# Patient Record
Sex: Female | Born: 1945 | Race: White | Hispanic: No | Smoking: Former smoker
Health system: Southern US, Community
[De-identification: ages and names within clinical notes are randomized; demographics above are authoritative.]

## PROBLEM LIST (undated history)

## (undated) DIAGNOSIS — I10 Essential (primary) hypertension: Secondary | ICD-10-CM

## (undated) DIAGNOSIS — I607 Nontraumatic subarachnoid hemorrhage from unspecified intracranial artery: Secondary | ICD-10-CM

## (undated) DIAGNOSIS — I639 Cerebral infarction, unspecified: Secondary | ICD-10-CM

## (undated) DIAGNOSIS — F32A Depression, unspecified: Secondary | ICD-10-CM

## (undated) DIAGNOSIS — E785 Hyperlipidemia, unspecified: Secondary | ICD-10-CM

## (undated) DIAGNOSIS — R32 Unspecified urinary incontinence: Secondary | ICD-10-CM

## (undated) DIAGNOSIS — I2510 Atherosclerotic heart disease of native coronary artery without angina pectoris: Secondary | ICD-10-CM

## (undated) DIAGNOSIS — F329 Major depressive disorder, single episode, unspecified: Secondary | ICD-10-CM

## (undated) DIAGNOSIS — Z8719 Personal history of other diseases of the digestive system: Secondary | ICD-10-CM

## (undated) HISTORY — DX: Hyperlipidemia, unspecified: E78.5

## (undated) HISTORY — DX: Nontraumatic subarachnoid hemorrhage from unspecified intracranial artery: I60.7

## (undated) HISTORY — PX: TUBAL LIGATION: SHX77

## (undated) HISTORY — DX: Depression, unspecified: F32.A

## (undated) HISTORY — DX: Unspecified urinary incontinence: R32

## (undated) HISTORY — DX: Atherosclerotic heart disease of native coronary artery without angina pectoris: I25.10

## (undated) HISTORY — DX: Major depressive disorder, single episode, unspecified: F32.9

## (undated) HISTORY — DX: Essential (primary) hypertension: I10

## (undated) HISTORY — PX: CORONARY ANGIOPLASTY WITH STENT PLACEMENT: SHX49

---
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
## 2004-07-01 DIAGNOSIS — I607 Nontraumatic subarachnoid hemorrhage from unspecified intracranial artery: Secondary | ICD-10-CM

## 2004-07-01 HISTORY — DX: Nontraumatic subarachnoid hemorrhage from unspecified intracranial artery: I60.7

## 2004-07-01 HISTORY — PX: CEREBRAL ANEURYSM REPAIR: SHX164

## 2004-07-01 HISTORY — PX: ORIF HIP FRACTURE: SHX2125

## 2008-07-05 ENCOUNTER — Ambulatory Visit: Payer: Self-pay | Admitting: Family Medicine

## 2008-07-05 DIAGNOSIS — F329 Major depressive disorder, single episode, unspecified: Secondary | ICD-10-CM

## 2008-07-05 DIAGNOSIS — J309 Allergic rhinitis, unspecified: Secondary | ICD-10-CM | POA: Insufficient documentation

## 2008-07-05 DIAGNOSIS — I10 Essential (primary) hypertension: Secondary | ICD-10-CM | POA: Insufficient documentation

## 2008-07-05 DIAGNOSIS — E785 Hyperlipidemia, unspecified: Secondary | ICD-10-CM

## 2008-07-05 DIAGNOSIS — I252 Old myocardial infarction: Secondary | ICD-10-CM | POA: Insufficient documentation

## 2008-07-05 DIAGNOSIS — I609 Nontraumatic subarachnoid hemorrhage, unspecified: Secondary | ICD-10-CM

## 2008-07-05 DIAGNOSIS — N318 Other neuromuscular dysfunction of bladder: Secondary | ICD-10-CM | POA: Insufficient documentation

## 2008-07-05 LAB — CONVERTED CEMR LAB: Pap Smear: NORMAL

## 2008-07-06 LAB — CONVERTED CEMR LAB
Albumin: 4.6 g/dL (ref 3.5–5.2)
Alkaline Phosphatase: 49 units/L (ref 39–117)
BUN: 17 mg/dL (ref 6–23)
CO2: 27 meq/L (ref 19–32)
Calcium: 9.4 mg/dL (ref 8.4–10.5)
Chloride: 102 meq/L (ref 96–112)
Glucose, Bld: 99 mg/dL (ref 70–99)
LDL Cholesterol: 104 mg/dL — ABNORMAL HIGH (ref 0–99)
Lymphocytes Relative: 24 % (ref 12–46)
Lymphs Abs: 2.2 10*3/uL (ref 0.7–4.0)
Monocytes Relative: 6 % (ref 3–12)
Neutro Abs: 6.2 10*3/uL (ref 1.7–7.7)
Neutrophils Relative %: 68 % (ref 43–77)
Platelets: 256 10*3/uL (ref 150–400)
Potassium: 4 meq/L (ref 3.5–5.3)
RBC: 4.7 M/uL (ref 3.87–5.11)
Sodium: 144 meq/L (ref 135–145)
Total Protein: 7.4 g/dL (ref 6.0–8.3)
Triglycerides: 143 mg/dL (ref <150)
WBC: 9.1 10*3/uL (ref 4.0–10.5)

## 2008-08-01 ENCOUNTER — Encounter (INDEPENDENT_AMBULATORY_CARE_PROVIDER_SITE_OTHER): Payer: Self-pay | Admitting: Family Medicine

## 2008-08-02 ENCOUNTER — Other Ambulatory Visit: Admission: RE | Admit: 2008-08-02 | Discharge: 2008-08-02 | Payer: Self-pay | Admitting: Family Medicine

## 2008-08-03 ENCOUNTER — Ambulatory Visit: Payer: Self-pay | Admitting: Family Medicine

## 2008-08-03 ENCOUNTER — Encounter (INDEPENDENT_AMBULATORY_CARE_PROVIDER_SITE_OTHER): Payer: Self-pay | Admitting: Family Medicine

## 2008-08-03 LAB — CONVERTED CEMR LAB
HDL goal, serum: 40 mg/dL
LDL Goal: 100 mg/dL

## 2008-08-09 ENCOUNTER — Ambulatory Visit (HOSPITAL_COMMUNITY): Admission: RE | Admit: 2008-08-09 | Discharge: 2008-08-09 | Payer: Self-pay | Admitting: Family Medicine

## 2008-08-16 ENCOUNTER — Ambulatory Visit: Payer: Self-pay | Admitting: Cardiology

## 2008-08-17 ENCOUNTER — Encounter (INDEPENDENT_AMBULATORY_CARE_PROVIDER_SITE_OTHER): Payer: Self-pay | Admitting: Family Medicine

## 2008-08-17 ENCOUNTER — Ambulatory Visit (HOSPITAL_COMMUNITY): Admission: RE | Admit: 2008-08-17 | Discharge: 2008-08-17 | Payer: Self-pay | Admitting: Cardiology

## 2008-08-17 ENCOUNTER — Encounter: Payer: Self-pay | Admitting: Cardiology

## 2008-08-18 ENCOUNTER — Ambulatory Visit (HOSPITAL_COMMUNITY): Admission: RE | Admit: 2008-08-18 | Discharge: 2008-08-18 | Payer: Self-pay | Admitting: Family Medicine

## 2008-08-18 ENCOUNTER — Ambulatory Visit: Payer: Self-pay | Admitting: Cardiology

## 2008-09-21 ENCOUNTER — Ambulatory Visit: Payer: Self-pay | Admitting: Family Medicine

## 2008-09-22 ENCOUNTER — Encounter (INDEPENDENT_AMBULATORY_CARE_PROVIDER_SITE_OTHER): Payer: Self-pay | Admitting: Family Medicine

## 2008-09-22 LAB — CONVERTED CEMR LAB
ALT: 22 units/L (ref 0–35)
AST: 19 units/L (ref 0–37)
Calcium: 9.9 mg/dL (ref 8.4–10.5)
Chloride: 102 meq/L (ref 96–112)
Creatinine, Ser: 0.95 mg/dL (ref 0.40–1.20)
Sodium: 142 meq/L (ref 135–145)
Total Bilirubin: 0.5 mg/dL (ref 0.3–1.2)
Total Protein: 7 g/dL (ref 6.0–8.3)

## 2008-11-03 ENCOUNTER — Ambulatory Visit: Payer: Self-pay | Admitting: Family Medicine

## 2008-11-04 ENCOUNTER — Encounter (INDEPENDENT_AMBULATORY_CARE_PROVIDER_SITE_OTHER): Payer: Self-pay | Admitting: *Deleted

## 2008-11-04 ENCOUNTER — Encounter (INDEPENDENT_AMBULATORY_CARE_PROVIDER_SITE_OTHER): Payer: Self-pay | Admitting: Family Medicine

## 2008-11-04 LAB — CONVERTED CEMR LAB
CO2: 26 meq/L (ref 19–32)
Cholesterol: 160 mg/dL (ref 0–200)
Creatinine, Ser: 1.13 mg/dL (ref 0.40–1.20)
Glucose, Bld: 93 mg/dL (ref 70–99)
Total Bilirubin: 0.3 mg/dL (ref 0.3–1.2)
Total CHOL/HDL Ratio: 3.3
Triglycerides: 132 mg/dL (ref <150)
VLDL: 26 mg/dL (ref 0–40)

## 2009-03-10 ENCOUNTER — Ambulatory Visit: Payer: Self-pay | Admitting: Family Medicine

## 2009-08-03 ENCOUNTER — Encounter (INDEPENDENT_AMBULATORY_CARE_PROVIDER_SITE_OTHER): Payer: Self-pay | Admitting: *Deleted

## 2009-08-03 ENCOUNTER — Ambulatory Visit: Payer: Self-pay | Admitting: Family Medicine

## 2009-08-03 DIAGNOSIS — R5381 Other malaise: Secondary | ICD-10-CM

## 2009-08-03 DIAGNOSIS — R5383 Other fatigue: Secondary | ICD-10-CM

## 2009-08-03 LAB — CONVERTED CEMR LAB
ALT: 18 units/L
ALT: 18 units/L (ref 0–35)
AST: 11 units/L
AST: 11 units/L (ref 0–37)
Albumin: 4.5 g/dL
Alkaline Phosphatase: 45 units/L
Basophils Absolute: 0 10*3/uL (ref 0.0–0.1)
Basophils Relative: 0 % (ref 0–1)
Bilirubin, Direct: 0.1 mg/dL (ref 0.0–0.3)
Calcium: 10.2 mg/dL (ref 8.4–10.5)
Cholesterol: 158 mg/dL (ref 0–200)
Glucose, Bld: 97 mg/dL
Glucose, Bld: 97 mg/dL (ref 70–99)
HDL: 49 mg/dL
HDL: 49 mg/dL (ref >39)
LDL Cholesterol: 80 mg/dL
MCHC: 32.2 g/dL (ref 30.0–36.0)
Neutro Abs: 6 10*3/uL (ref 1.7–7.7)
Neutrophils Relative %: 67 % (ref 43–77)
Platelets: 247 10*3/uL (ref 150–400)
Potassium: 4.3 meq/L
RDW: 13.9 % (ref 11.5–15.5)
Sodium: 141 meq/L
Sodium: 141 meq/L (ref 135–145)
TSH: 2.851 microintl units/mL (ref 0.350–4.500)
Total CHOL/HDL Ratio: 3.2
Total Protein: 7 g/dL
Triglycerides: 144 mg/dL

## 2009-08-08 ENCOUNTER — Ambulatory Visit (HOSPITAL_COMMUNITY): Admission: RE | Admit: 2009-08-08 | Discharge: 2009-08-08 | Payer: Self-pay | Admitting: Family Medicine

## 2009-08-10 ENCOUNTER — Ambulatory Visit (HOSPITAL_COMMUNITY): Admission: RE | Admit: 2009-08-10 | Discharge: 2009-08-10 | Payer: Self-pay | Admitting: Family Medicine

## 2009-08-17 ENCOUNTER — Encounter: Payer: Self-pay | Admitting: Family Medicine

## 2009-08-23 ENCOUNTER — Ambulatory Visit (HOSPITAL_COMMUNITY): Admission: RE | Admit: 2009-08-23 | Discharge: 2009-08-23 | Payer: Self-pay | Admitting: Family Medicine

## 2009-09-01 ENCOUNTER — Encounter (INDEPENDENT_AMBULATORY_CARE_PROVIDER_SITE_OTHER): Payer: Self-pay | Admitting: *Deleted

## 2009-09-05 ENCOUNTER — Ambulatory Visit: Payer: Self-pay | Admitting: Cardiology

## 2009-09-11 DIAGNOSIS — I2510 Atherosclerotic heart disease of native coronary artery without angina pectoris: Secondary | ICD-10-CM | POA: Insufficient documentation

## 2009-12-06 ENCOUNTER — Ambulatory Visit: Payer: Self-pay | Admitting: Family Medicine

## 2009-12-06 DIAGNOSIS — L989 Disorder of the skin and subcutaneous tissue, unspecified: Secondary | ICD-10-CM | POA: Insufficient documentation

## 2009-12-06 DIAGNOSIS — D649 Anemia, unspecified: Secondary | ICD-10-CM

## 2009-12-08 ENCOUNTER — Encounter: Payer: Self-pay | Admitting: Family Medicine

## 2010-01-16 ENCOUNTER — Telehealth: Payer: Self-pay | Admitting: Family Medicine

## 2010-02-07 ENCOUNTER — Encounter: Payer: Self-pay | Admitting: Family Medicine

## 2010-02-08 LAB — CONVERTED CEMR LAB
AST: 13 units/L (ref 0–37)
Alkaline Phosphatase: 33 units/L — ABNORMAL LOW (ref 39–117)
BUN: 18 mg/dL (ref 6–23)
Bilirubin, Direct: 0.2 mg/dL (ref 0.0–0.3)
CO2: 29 meq/L (ref 19–32)
Calcium: 9.6 mg/dL (ref 8.4–10.5)
Creatinine, Ser: 0.98 mg/dL (ref 0.40–1.20)
Glucose, Bld: 103 mg/dL — ABNORMAL HIGH (ref 70–99)
LDL Cholesterol: 74 mg/dL (ref 0–99)
Total Bilirubin: 0.8 mg/dL (ref 0.3–1.2)

## 2010-02-27 ENCOUNTER — Ambulatory Visit: Payer: Self-pay | Admitting: Family Medicine

## 2010-03-20 ENCOUNTER — Ambulatory Visit: Payer: Self-pay | Admitting: Family Medicine

## 2010-07-11 LAB — CONVERTED CEMR LAB
ALT: 18 units/L (ref 0–35)
AST: 13 units/L (ref 0–37)
Alkaline Phosphatase: 47 units/L (ref 39–117)
Basophils Absolute: 0 10*3/uL (ref 0.0–0.1)
Bilirubin, Direct: 0.3 mg/dL (ref 0.0–0.3)
Calcium: 10.2 mg/dL (ref 8.4–10.5)
Cholesterol: 161 mg/dL (ref 0–200)
Creatinine, Ser: 0.91 mg/dL (ref 0.40–1.20)
Eosinophils Absolute: 0.2 10*3/uL (ref 0.0–0.7)
Eosinophils Relative: 2 % (ref 0–5)
Glucose, Bld: 106 mg/dL — ABNORMAL HIGH (ref 70–99)
HCT: 40.8 % (ref 36.0–46.0)
Hemoglobin: 13.5 g/dL (ref 12.0–15.0)
Indirect Bilirubin: 0.4 mg/dL (ref 0.0–0.9)
Lymphs Abs: 2.3 10*3/uL (ref 0.7–4.0)
MCV: 85.5 fL (ref 78.0–100.0)
Monocytes Absolute: 0.6 10*3/uL (ref 0.1–1.0)
Platelets: 243 10*3/uL (ref 150–400)
RDW: 14.3 % (ref 11.5–15.5)
Sodium: 145 meq/L (ref 135–145)
TSH: 3.423 microintl units/mL (ref 0.350–4.500)
Total Bilirubin: 0.7 mg/dL (ref 0.3–1.2)
Total CHOL/HDL Ratio: 3.7

## 2010-07-18 ENCOUNTER — Ambulatory Visit
Admission: RE | Admit: 2010-07-18 | Discharge: 2010-07-18 | Payer: Self-pay | Source: Home / Self Care | Attending: Family Medicine | Admitting: Family Medicine

## 2010-07-20 ENCOUNTER — Other Ambulatory Visit (HOSPITAL_COMMUNITY): Payer: Self-pay | Admitting: *Deleted

## 2010-07-20 DIAGNOSIS — Z139 Encounter for screening, unspecified: Secondary | ICD-10-CM

## 2010-07-31 NOTE — Letter (Signed)
Summary: breast center order form  breast center order form   Imported By: Rudene Anda 08/17/2009 13:25:28  _____________________________________________________________________  External Attachment:    Type:   Image     Comment:   External Document

## 2010-07-31 NOTE — Progress Notes (Signed)
Summary: MEDICINE  Phone Note Call from Patient   Summary of Call: dr tapered her off her PROZAC AND NOW SHE NEEDS THEM BACK SHE IS FEELING ANXIOUS AND AFRAID [facility] [place] 3 MONTH SUPPLY CALL BACK AT [phone] TO LET HER KNOW Initial call taken by: Lind Guest,  January 16, 2010 9:21 AM  Follow-up for Phone Call        pls tell her 90 day supply sent in andshe needs to sched an appt to come in in the next 6 to 8 weeks Follow-up by: Syliva Overman MD,  January 16, 2010 12:15 PM  Additional Follow-up for Phone Call Additional follow up Details #1::        Phone Call Completed Additional Follow-up by: Adella Hare LPN,  January 16, 2010 2:44 PM    New/Updated Medications: FLUOXETINE HCL 20 MG CAPS (FLUOXETINE HCL) Take 1 capsule by mouth once a day Prescriptions: FLUOXETINE HCL 20 MG CAPS (FLUOXETINE HCL) Take 1 capsule by mouth once a day  #90 x 0   Entered and Authorized by:   Syliva Overman MD   Signed by:   Syliva Overman MD on 01/16/2010   Method used:   Electronically to        [facility] [address] [facility] (retail)       [address]       [place], [place]  [address]       Ph: [phone]       Fax: [phone]   RxID:   [id number]

## 2010-07-31 NOTE — Assessment & Plan Note (Signed)
Summary: NEW PATIENT   Vital Signs:  Patient profile:   65 year old female Height:      62 inches Weight:      150 pounds BMI:     27.53 O2 Sat:      96 % Pulse rate:   66 / minute Pulse rhythm:   regular Resp:     16 per minute BP sitting:   122 / 80  Vitals Entered By: Everitt Amber (August 03, 2009 8:08 AM)  Nutrition Counseling: Patient's BMI is greater than 25 and therefore counseled on weight management options. CC: New Patient   Primary Care Provider:  Syliva Overman MD  CC:  New Patient.  History of Present Illness: new pt eval for Angela Lambert, whose med history is reviewed from old records. she states she has been doing fairly well since she was last seen by gher previous provider.  She has no specific  complaints or concerns.  Denies recent fever or chills. Denies sinus pressure, nasal congestion , ear pain or sore throat. Denies chest congestion, or cough productive of sputum. Denies chest pain, palpitations, PND, orthopnea or leg swelling. Denies abdominal pain, nausea, vomitting, diarrhea or constipation. Denies change in bowel movements or bloody stool. Denies dysuria , frequency, incontinence or hesitancy. Denies  joint pain, swelling, or reduced mobility. Denies headaches, vertigo, seizures. Denies depression, anxiety or insomnia. Denies  rash, lesions, or itch.      Current Medications (verified): 1)  Simvastatin 80 Mg Tabs (Simvastatin) .... One Daily 2)  Prozac 20 Mg Caps (Fluoxetine Hcl) .... Once Daily 3)  Metoprolol Tartrate 50 Mg Tabs (Metoprolol Tartrate) .... Once Daily 4)  Lisinopril 40 Mg Tabs (Lisinopril) .... One Daily 5)  Hydrochlorothiazide 12.5 Mg Caps (Hydrochlorothiazide) .... Once Daily 6)  Aspirin Ec 325 Mg Tbec (Aspirin) .... Once Daily 7)  Multivitamins  Tabs (Multiple Vitamin) .... Once Daily 8)  Garlic 1000 Mg Caps (Garlic) .... Once Daily 9)  Vitamin D 400 Unit Caps (Cholecalciferol) .... Once Daily 10)  Calcium  600/vitamin D 600-400 Mg-Unit Tabs (Calcium Carbonate-Vitamin D) .... One Two Times A Day  Allergies (verified): No Known Drug Allergies  Past History:  Family History: Last updated: 07/05/2008 Father - age ? 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  Risk Factors: Alcohol Use: 0 (11/03/2008)  Risk Factors: Smoking Status: quit (11/03/2008) Packs/Day: 2.0 (11/03/2008)  Past Medical History: Current Problems:  CAD (ICD-414.00)  stent placed 05/2002 OVERACTIVE BLADDER (ICD-596.51), stress incontinence MYOCARDIAL INFARCTION, HX OF (ICD-412) - Age 59 HYPERTENSION (ICD-401.9) HYPERLIPIDEMIA (ICD-272.4) DEPRESSION (ICD-311)  2006,  after rehab from stroke for 1 month ALLERGIC RHINITIS (ICD-477.9) 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 Endo:  Denies cold intolerance, excessive hunger, excessive thirst, excessive urination, heat intolerance, polyuria, and weight change. Heme:  Denies abnormal bruising and bleeding. Allergy:  Denies hives or rash and sneezing.  Physical Exam  General:  Well-developed,well-nourished,in no acute distress; alert,appropriate and cooperative throughout examination HEENT: No facial  asymmetry,  EOMI, No sinus tenderness, TM's Clear, oropharynx  pink and moist.   Chest: Clear to auscultation bilaterally.  CVS: S1, S2, No murmurs, No S3.   Abd: Soft, Nontender.  MS: Adequate ROM spine, hips, shoulders and knees. Ambulates with a cane Ext: No edema.   CNS: CN 2-12 intact, power tone and sensation normal throughout.   Skin: Intact, no visible lesions or rashes.  Psych: Good eye contact, normal affect.  Memory , loss,not anxious or depressed appearing.    Impression & Recommendations:  Problem # 1:  SPECIAL SCREENING FOR OSTEOPOROSIS (ICD-V82.81) Assessment Comment Only  Orders: Radiology Referral (Radiology)  Problem # 2:  HYPERTENSION (ICD-401.9) Assessment: Unchanged  Her updated medication list for this problem includes:    Metoprolol Tartrate 50 Mg Tabs (Metoprolol tartrate) ..... Once daily    Lisinopril 40 Mg Tabs (Lisinopril) ..... One daily    Hydrochlorothiazide 12.5 Mg Caps (Hydrochlorothiazide) ..... Once daily  BP today: 122/80 Prior BP: 133/74 (03/10/2009)  Prior 10 Yr Risk Heart Disease: N/A (07/05/2008)  Labs Reviewed: K+: 4.4 (11/04/2008) Creat: : 1.13 (11/04/2008)   Chol: 160 (11/04/2008)   HDL: 49 (11/04/2008)   LDL: 85 (11/04/2008)   TG: 132 (11/04/2008)  Orders: T-Basic Metabolic Panel [phone])  Problem # 3:  HYPERLIPIDEMIA (ICD-272.4) Assessment: Comment Only  Her updated medication list for this problem includes:    Simvastatin 80 Mg Tabs (Simvastatin) ..... One daily  Orders: T-Hepatic Function [phone]) T-Lipid Profile [phone])  Labs Reviewed: SGOT: 15 (11/04/2008)   SGPT: 27 (11/04/2008)  Lipid Goals: Chol Goal: 200 (08/03/2008)   HDL Goal: 40 (08/03/2008)   LDL Goal: 100 (08/03/2008)   TG Goal: 150 (08/03/2008)  Prior 10 Yr Risk Heart Disease: N/A (07/05/2008)   HDL:49 (11/04/2008), 56 (07/05/2008)  LDL:85 (11/04/2008), 104 (57/84/6962)  Chol:160 (11/04/2008), 189 (07/05/2008)  Trig:132 (11/04/2008),  143 (07/05/2008)  Complete Medication List: 1)  Simvastatin 80 Mg Tabs (Simvastatin) .... One daily 2)  Metoprolol Tartrate 50 Mg Tabs (Metoprolol tartrate) .... Once daily 3)  Lisinopril 40 Mg Tabs (Lisinopril) .... One daily 4)  Hydrochlorothiazide 12.5 Mg Caps (Hydrochlorothiazide) .... Once daily 5)  Aspirin Ec 325 Mg Tbec (Aspirin) .... Once daily 6)  Multivitamins Tabs (Multiple vitamin) .... Once daily 7)  Garlic 1000 Mg Caps (Garlic) .... Once daily 8)  Vitamin D 400 Unit Caps (Cholecalciferol) .... Once daily 9)  Calcium 600/vitamin D 600-400 Mg-unit Tabs (Calcium carbonate-vitamin d) .... One two times a day 10)  Prozac 10 Mg Caps (Fluoxetine hcl) .... Use as directed 11)  Alendronate Sodium 70 Mg Tabs (Alendronate sodium) .... One tablet once weekly  Other Orders: T-CBC w/Diff [phone]) T-TSH [phone]) T-Vitamin D (25-Hydroxy) [phone]) Cardiology Referral (Cardiology) Tdap => 15yrs IM [phone]) Admin 1st Vaccine (56433) Admin 1st Vaccine [facility]) [phone])  Patient Instructions: 1)  CPE months. 2)  yOU WILL BE REFERRED FOR A BONE DENSITY SCSN AND TO F/U WITH dR ROT[facility]ART. 3)  BMP prior to visit, ICD-9: 4)  Hepatic Panel prior to visit, ICD-9: 5)  Lipid Panel prior to visit, ICD-9: 6)  TSH prior to visit, ICD-9: 7)  CBC w/ Diff prior to visit,  ICD-9: 8)  Vit D level  9)  PROZAC 10 mg tapering doses as below 10)  one daily for 3 weeks then one every Mon,Wed , fri and sunday for 3 weeks , then one every Tues and Thursday  , Saturdayfor 3 weeks then one twice weekly for 2 weeks then stop.    Prescriptions: ALENDRONATE SODIUM 70 MG TABS (ALENDRONATE SODIUM) one tablet once weekly  #4 x 11   Entered and Authorized by:   Marcellene Shivley MD   Signed by:   Khylin Gutridge MD on 08/09/2009   Method used:   Electronically to        [facility] [address] [facility] (retail)       [address]       [place]       [place], [place]  [address]       Ph: [phone]       Fax:  [phone]   RxID:   [id number] PROZAC 10 MG CAPS (FLUOXETINE HCL) Use as directed  #30 x 1   Entered and Authorized by:   Abdoul Encinas MD   Signed by:   Nainika Newlun MD on 08/03/2009   Method used:   Printed then faxed to ...       [id number] (retail)       [address]       [place], [place]  [address]       Ph: [phone] or [phone]       Fax: [phone]   RxID:   [id number]       Tetanus/Td Vaccine    Vaccine Type: Tdap    Site: right deltoid    Mfr: Sanofi Pasteur    Dose: 0.5 ml    Route: IM    Given by: Everitt Amber    Exp. Date: 07/29/2011    Lot #: [id number]

## 2010-07-31 NOTE — Miscellaneous (Signed)
Summary: LABS BMP,LIPIDS,LIVER,08/03/2009  Clinical Lists Changes  Observations: Added new observation of CALCIUM: 10.2 mg/dL (04/54/0981 19:14) Added new observation of ALBUMIN: 4.5 g/dL (78/29/5621 30:86) Added new observation of PROTEIN, TOT: 7.0 g/dL (57/84/6962 95:28) Added new observation of SGPT (ALT): 18 units/L (08/03/2009 14:48) Added new observation of SGOT (AST): 11 units/L (08/03/2009 14:48) Added new observation of ALK PHOS: 45 units/L (08/03/2009 14:48) Added new observation of BILI DIRECT: 0.1 mg/dL (41/32/4401 02:72) Added new observation of CREATININE: 1.10 mg/dL (53/66/4403 47:42) Added new observation of BUN: 22 mg/dL (59/56/3875 64:33) Added new observation of BG RANDOM: 97 mg/dL (29/51/8841 66:06) Added new observation of CO2 PLSM/SER: 29 meq/L (08/03/2009 14:48) Added new observation of CL SERUM: 101 meq/L (08/03/2009 14:48) Added new observation of K SERUM: 4.3 meq/L (08/03/2009 14:48) Added new observation of NA: 141 meq/L (08/03/2009 14:48) Added new observation of LDL: 80 mg/dL (30/16/0109 32:35) Added new observation of HDL: 49 mg/dL (57/32/2025 42:70) Added new observation of TRIGLYC TOT: 144 mg/dL (62/37/6283 15:17) Added new observation of CHOLESTEROL: 158 mg/dL (61/60/7371 06:26) Added new observation of TSH: 2.851 microintl units/mL (08/03/2009 14:48)

## 2010-07-31 NOTE — Letter (Signed)
Summary: removal of a skin tag  removal of a skin tag   Imported By: Lind Guest 12/08/2009 16:27:15  _____________________________________________________________________  External Attachment:    Type:   Image     Comment:   External Document

## 2010-07-31 NOTE — Assessment & Plan Note (Signed)
Summary: removed a skin tag   Primary Provider:  Syliva Overman MD   History of Present Illness: Pt seen today by Dr Lodema Hong.  See other visit documentation today for vital signs, etc. Pt has a skin tag on her Rt neck that is bothersome to her.  It catches & becomes irritated. No redness or swelling.  Allergies: No Known Drug Allergies  Physical Exam  Skin:  Rt neck skin tag noted.  Pt also has 2 tiny approx 1 mm tags noted on her neck but these arent bothersome to her. Additional Exam:  Prodedure Note: Consent signed. Area cleansed with alcohol swab. Skin tag was then removed with iris scissors. Very minimal bleeding.  No hemostasis required.    Impression & Recommendations:  Problem # 1:  SKIN TAG (ICD-701.9) Assessment Comment Only  Rt neck.  Orders: Removal of Skin Tags up to 15 Lesions (11200)  Complete Medication List: 1)  Simvastatin 80 Mg Tabs (Simvastatin) .... One daily 2)  Metoprolol Tartrate 50 Mg Tabs (Metoprolol tartrate) .... Once daily 3)  Lisinopril 40 Mg Tabs (Lisinopril) .... One daily 4)  Hydrochlorothiazide 12.5 Mg Caps (Hydrochlorothiazide) .... Once daily 5)  Aspirin Ec 325 Mg Tbec (Aspirin) .... Once daily 6)  Multivitamins Tabs (Multiple vitamin) .... Once daily 7)  Garlic 1000 Mg Caps (Garlic) .... Once daily 8)  Vitamin D 400 Unit Caps (Cholecalciferol) .... Once daily 9)  Calcium 600/vitamin D 600-400 Mg-unit Tabs (Calcium carbonate-vitamin d) .... One two times a day 10)  Alendronate Sodium 70 Mg Tabs (Alendronate sodium) .... One tablet once weekly  Patient Instructions: 1)  Instructed pt that she may wash, etc as normal. 2)  To watch for signs of infection, such as redness, swelling, or drainage.

## 2010-07-31 NOTE — Assessment & Plan Note (Signed)
Summary: PHY   Vital Signs:  Patient profile:   65 year old female Menstrual status:  postmenopausal Height:      62 inches Weight:      150 pounds BMI:     27.53 O2 Sat:      94 % Pulse rate:   83 / minute Pulse rhythm:   regular Resp:     16 per minute BP sitting:   138 / 78  (left arm) Cuff size:   regular  Vitals Entered By: Everitt Amber LPN (December 06, 1608 7:55 AM)  Nutrition Counseling: Patient's BMI is greater than 25 and therefore counseled on weight management options. CC: CPE  Vision Screening:Left eye with correction: 20 / 40 Right eye with correction: 20 / 40 Both eyes with correction: 20 / 30  Color vision testing: normal      Vision Entered By: Everitt Amber LPN (December 07, 9602 7:58 AM)     Menstrual Status postmenopausal Last PAP Result NEGATIVE FOR INTRAEPITHELIAL LESIONS OR MALIGNANCY.   Primary Care Provider:  Syliva Overman MD  CC:  CPE.  History of Present Illness: Reports  that she has been  doing well. Denies recent fever or chills. Denies sinus pressure, nasal congestion , ear pain or sore throat. Denies chest congestion, or cough productive of sputum. Denies chest pain, palpitations, PND, orthopnea or leg swelling. Denies abdominal pain, nausea, vomitting, diarrhea or constipation. Denies change in bowel movements or bloody stool. Denies dysuria , frequency, incontinence or hesitancy. Denies  joint pain, swelling, or reduced mobility. Denies headaches, vertigo, seizures. Denies depression, anxiety or insomnia. sahe does have pigmented nevi as  well as a skin tag which she would like to be removed.   Current Medications (verified): 1)  Simvastatin 80 Mg Tabs (Simvastatin) .... One Daily 2)  Metoprolol Tartrate 50 Mg Tabs (Metoprolol Tartrate) .... Once Daily 3)  Lisinopril 40 Mg Tabs (Lisinopril) .... One Daily 4)  Hydrochlorothiazide 12.5 Mg Caps (Hydrochlorothiazide) .... Once Daily 5)  Aspirin Ec 325 Mg Tbec (Aspirin) .... Once  Daily 6)  Multivitamins  Tabs (Multiple Vitamin) .... Once Daily 7)  Garlic 1000 Mg Caps (Garlic) .... Once Daily 8)  Vitamin D 400 Unit Caps (Cholecalciferol) .... Once Daily 9)  Calcium 600/vitamin D 600-400 Mg-Unit Tabs (Calcium Carbonate-Vitamin D) .... One Two Times A Day 10)  Alendronate Sodium 70 Mg Tabs (Alendronate Sodium) .... One Tablet Once Weekly  Allergies (verified): No Known Drug Allergies  Review of Systems      See HPI Eyes:  Denies blurring and discharge. MS:  Complains of muscle weakness; s/p cva withresidual left sided weakness and sensory deficit. Derm:  Complains of lesion(s); multiple skion lesions and skin tag  on right side of neck which is unchanged. Neuro:  Complains of numbness, poor balance, and weakness; left upper and lower numbness. Endo:  Denies cold intolerance, excessive hunger, excessive thirst, excessive urination, heat intolerance, polyuria, and weight change. Heme:  Denies abnormal bruising and bleeding. Allergy:  Complains of seasonal allergies.  Physical Exam  General:  Well-developed,well-nourished,in no acute distress; alert,appropriate and cooperative throughout examination Head:  Normocephalic and atraumatic without obvious abnormalities. No apparent alopecia or balding. Eyes:  No corneal or conjunctival inflammation noted. EOMI. Perrla. Funduscopic exam benign, without hemorrhages, exudates or papilledema. Vision grossly normal. Ears:  External ear exam shows no significant lesions or deformities.  Otoscopic examination reveals clear canals, tympanic membranes are intact bilaterally without bulging, retraction, inflammation or discharge. Hearing is grossly  normal bilaterally. Nose:  External nasal examination shows no deformity or inflammation. Nasal mucosa are pink and moist without lesions or exudates. Mouth:  Oral mucosa and oropharynx without lesions or exudates.  Teeth in good repair. Neck:  No deformities, masses, or tenderness  noted. Chest Wall:  No deformities, masses, or tenderness noted. Breasts:  No mass, nodules, thickening, tenderness, bulging, retraction, inflamation, nipple discharge or skin changes noted.   Lungs:  Normal respiratory effort, chest expands symmetrically. Lungs are clear to auscultation, no crackles or wheezes. Heart:  Normal rate and regular rhythm. S1 and S2 normal without gallop, murmur, click, rub or other extra sounds. Abdomen:  Bowel sounds positive,abdomen soft and non-tender without masses, organomegaly or hernias noted. Rectal:  No external abnormalities noted. Normal sphincter tone. No rectal masses or tenderness.Guaic neg stool. Genitalia:  normal introitus, no external lesions, no vaginal atrophy, and no adnexal masses or tenderness. Uterus absent  Msk:  No deformity or scoliosis noted of thoracic or lumbar spine.   Pulses:  R and L carotid,radial,femoral,dorsalis pedis and posterior tibial pulses are full and equal bilaterally Extremities:  decreased power in left upper and lower extremities with reduced sensation also Neurologic:  alert & oriented X3 and cranial nerves II-XII intact. Abnormal gait.  Skin:  multiple pigmewnted nevi, some with irregular borders and irregularity in pigmentation Cervical Nodes:  No lymphadenopathy noted Axillary Nodes:  No palpable lymphadenopathy Inguinal Nodes:  No significant adenopathy Psych:  Cognition and judgment appear intact. Alert and cooperative with normal attention span and concentration. No apparent delusions, illusions, hallucinations   Impression & Recommendations:  Problem # 1:  SPECIAL SCREENING FOR MALIGNANT NEOPLASMS COLON (ICD-V76.51) Assessment Comment Only  Orders: Hemoccult Guaiac-1 spec.(in office) ([id number])  Problem # 2:  SKIN LESIONS, MULTIPLE (ICD-709.9) Assessment: Comment Only  Orders: Dermatology Referral (Derma)  Problem # 3:  SKIN TAG (ICD-701.9) Assessment: Comment Only for excision today  Problem # 4:   HYPERTENSION (ICD-401.9) Assessment: Unchanged  Her updated medication list for this problem includes:    Metoprolol Tartrate 50 Mg Tabs (Metoprolol tartrate) ..... Once daily    Lisinopril 40 Mg Tabs (Lisinopril) ..... One daily    Hydrochlorothiazide 12.5 Mg Caps (Hydrochlorothiazide) ..... Once daily  Orders: T-Basic Metabolic Panel [phone])  BP today: 138/78 Prior BP: 133/58 (09/05/2009)  Prior 10 Yr Risk Heart Disease: N/A (07/05/2008)  Labs Reviewed: K+: 4.3 (08/03/2009) Creat: : 1.10 (08/03/2009)   Chol: 158 (08/03/2009)   HDL: 49 (08/03/2009)   LDL: 80 (08/03/2009)   TG: 144 (08/03/2009)  Problem # 5:  DEPRESSION (ICD-311) Assessment: Improved  The following medications were removed from the medication list:    Prozac 10 Mg Caps (Fluoxetine hcl) ..... Use as directed  Problem # 6:  HYPERLIPIDEMIA (ICD-272.4) Assessment: Comment Only  Her updated medication list for this problem includes:    Simvastatin 80 Mg Tabs (Simvastatin) ..... One daily  Orders: T-Hepatic Function [phone]) T-Lipid Profile [phone]), p;ast due  Labs Reviewed: SGOT: 11 (08/03/2009)   SGPT: 18 (08/03/2009)  Lipid Goals: Chol Goal: 200 (08/03/2008)   HDL Goal: 40 (08/03/2008)   LDL Goal: 100 (08/03/2008)   TG Goal: 150 (08/03/2008)  Prior 10 Yr Risk Heart Disease: N/A (07/05/2008)   HDL:49 (08/03/2009), 49 (08/03/2009)  LDL:80 (08/03/2009), 80 (08/03/2009)  Chol:158 (08/03/2009), 158 (08/03/2009)  Trig:144 (08/03/2009), 144 (08/03/2009)  Complete Medication List: 1)  Simvastatin 80 Mg Tabs (Simvastatin) .... One daily 2)  Metoprolol Tartrate 50 Mg Tabs (Metoprolol tartrate) .... Once daily 3)  Lisinopril 40 Mg Tabs (Lisinopril) .... One daily 4)  Hydrochlorothiazide 12.5 Mg Caps (Hydrochlorothiazide) .... Once daily 5)  Aspirin Ec 325 Mg Tbec (Aspirin) .... Once daily 6)  Multivitamins Tabs (Multiple vitamin) .... Once daily 7)  Garlic 1000 Mg Caps (Garlic) .... Once  daily 8)  Vitamin D 400 Unit Caps (Cholecalciferol) .... Once daily 9)  Calcium 600/vitamin D 600-400 Mg-unit Tabs (Calcium carbonate-vitamin d) .... One two times a day 10)  Alendronate Sodium 70 Mg Tabs (Alendronate sodium) .... One tablet once weekly  Other Orders: T-CBC w/Diff ([id number]) T- * Misc. Laboratory test [phone])  Patient Instructions: 1)  F/u in 4 months. 2)  cBC and anemia panel today. 3)  the skin tag will be removed in the offoce today, and I will refer you to the dermatologist for skin evaluatoion, since you have multiple skin lesions. 4)  Your labwork in [facility] was great,  5)  BMP prior to visit, ICD-9: 6)  Hepatic Panel prior to visit, ICD-9:   fasting end August. 7)  Lipid Panel prior to visit, ICD-9:    Laboratory Results    Stool - Occult Blood Hemmoccult #1: negative Date: 12/06/2009 Comments: [id number] 9R 8/11 118 10 12

## 2010-07-31 NOTE — Assessment & Plan Note (Signed)
Summary: flu shot  Nurse Visit   Allergies: No Known Drug Allergies  Immunizations Administered:  Influenza Vaccine # 1:    Vaccine Type: Fluvax Non-MCR    Site: left deltoid    Mfr: novartis    Dose: 0.5 ml    Route: IM    Given by: Adella Hare LPN    Exp. Date: 10/2010    Lot #: [id number]    VIS given: 01/23/10 version given March 20, 2010.  Orders Added: 1)  Influenza Vaccine NON MCR [[id number]]

## 2010-07-31 NOTE — Assessment & Plan Note (Signed)
Summary: past due for f/u per Dr.Simpson's office/tg   Visit Type:  Follow-up Primary Provider:  Syliva Overman MD   History of Present Illness: Scheduled return visit for this very pleasant 65 year old woman with a history of previous myocardial infarction requiring percutaneous intervention.  Since her last visit, she has been generally well.  She experiences no significant chest discomfort or dyspnea.  Lifestyle is relatively inactive.  She has had no new medical problems nor have there been any significant changes in her medical therapy.  Current Medications (verified): 1)  Simvastatin 80 Mg Tabs (Simvastatin) .... One Daily 2)  Metoprolol Tartrate 50 Mg Tabs (Metoprolol Tartrate) .... Once Daily 3)  Lisinopril 40 Mg Tabs (Lisinopril) .... One Daily 4)  Hydrochlorothiazide 12.5 Mg Caps (Hydrochlorothiazide) .... Once Daily 5)  Aspirin Ec 325 Mg Tbec (Aspirin) .... Once Daily 6)  Multivitamins  Tabs (Multiple Vitamin) .... Once Daily 7)  Garlic 1000 Mg Caps (Garlic) .... Once Daily 8)  Vitamin D 400 Unit Caps (Cholecalciferol) .... Once Daily 9)  Calcium 600/vitamin D 600-400 Mg-Unit Tabs (Calcium Carbonate-Vitamin D) .... One Two Times A Day 10)  Prozac 10 Mg Caps (Fluoxetine Hcl) .... Use As Directed 11)  Alendronate Sodium 70 Mg Tabs (Alendronate Sodium) .... One Tablet Once Weekly  Allergies (verified): No Known Drug Allergies  Past History:  PMH, FH, and Social History reviewed and updated.  Past Medical History: ASCVD:  stent placed 05/2002 following acute myocardial infarction MYOCARDIAL INFARCTION, HX OF (ICD-412) - Age 80 HYPERTENSION (ICD-401.9) HYPERLIPIDEMIA (ICD-272.4) Right Cerebral aneurysm-ruptured  in 2006; coma for approx 6 weeks DEPRESSION (ICD-311)  2006,  after rehab from stroke for 1 month ALLERGIC RHINITIS (ICD-477.9) OVERACTIVE BLADDER (ICD-596.51), stress incontinence  Past Surgical History: Repair of ruptured right cerebral aneurysm- 2006 ORIF  hip - 2006, s/p fall  Review of Systems  The patient denies hoarseness, chest pain, syncope, dyspnea on exertion, peripheral edema, prolonged cough, and hemoptysis.    Vital Signs:  Patient profile:   65 year old female Weight:      152 pounds Pulse rate:   70 / minute BP sitting:   133 / 58  (right arm)  Vitals Entered By: Larita Fife Via LPN (September 06, 5619 2:49 PM)  Physical Exam  General:  Pleasant woman in no acute distress. Weight is 155. HEENT:  Anicteric sclerae; normal lids and conjunctivae; normal oral mucosa. NECK:  No jugular venous distention; normal carotid upstrokes without bruits. SKIN:  Multiple keratoses LUNGS:  Clear. CARDIAC:  Normal first heart sounds; prominent splitting of the second heart sounds; minimal systolic ejection murmur; S4 present ABDOMEN:  Soft and nontender; no organomegaly. EXTREMITIES:  No edema; normal distal pulses. NEUROLOGIC:  Symmetric strength and tone; normal cranial nerves.    Impression & Recommendations:  Problem # 1:  ATHEROSCLEROTIC CARDIOVASCULAR DISEASE (ICD-429.2) No symptoms at present to suggest myocardial ischemia.  Echocardiogram in 08/2008 reveals no evidence for previous myocardial infarction.  Mild-to-moderate left ventricular hypertrophy was present with normal regional and global LV systolic function.  There were no significant valvular abnormalities.  Management will continue to focus on optimal control of risk factors.  Her dose of aspirin will be reduced to 81 mg q.d.  Problem # 2:  HYPERTENSION (ICD-401.9) Blood pressure control is good today.  Patient will continue to monitor at home.  Problem # 3:  HYPERLIPIDEMIA (ICD-272.4)  Most recent lipid profile just one month ago was quite good with total cholesterol of 158, HDL 49, triglycerides 144  and LDL of 80.  Current therapy will be continued.  I will reassess this nice woman in one year.  Patient Instructions: 1)  Your physician recommends that you schedule a  follow-up appointment in: 1 year 2)  Your physician recommends that you continue on your current medications as directed. Please refer to the Current Medication list given to you today.

## 2010-07-31 NOTE — Assessment & Plan Note (Signed)
Summary: office visit   Vital Signs:  Patient profile:   65 year old female Menstrual status:  postmenopausal Height:      62 inches Weight:      144 pounds BMI:     26.43 O2 Sat:      96 % on Room air Pulse rate:   70 / minute Pulse rhythm:   regular Resp:     16 per minute BP sitting:   124 / 78  (left arm)  Vitals Entered By: Adella Hare LPN (February 27, 2010 10:34 AM)  Nutrition Counseling: Patient's BMI is greater than 25 and therefore counseled on weight management options.  O2 Flow:  Room air CC: follow-up visit Is Patient Diabetic? No Pain Assessment Patient in pain? no        Primary Care Provider:  Syliva Overman MD  CC:  follow-up visit.  History of Present Illness: .Reports  that she has been doing much better since resuming prozac Denies recent fever or chills. Denies sinus pressure, nasal congestion , ear pain or sore throat. Denies chest congestion, or cough productive of sputum. Denies chest pain, palpitations, PND, orthopnea or leg swelling. Denies abdominal pain, nausea, vomitting, diarrhea or constipation. Denies change in bowel movements or bloody stool. Denies dysuria , frequency, incontinence or hesitancy. Denies  joint pain, swelling, or reduced mobility. Denies headaches, vertigo, seizures. Denies depression, anxiety or insomnia. Denies  rash, lesions, or itch.     Current Medications (verified): 1)  Simvastatin 80 Mg Tabs (Simvastatin) .... One Daily 2)  Metoprolol Tartrate 50 Mg Tabs (Metoprolol Tartrate) .... Once Daily 3)  Lisinopril 40 Mg Tabs (Lisinopril) .... One Daily 4)  Hydrochlorothiazide 12.5 Mg Caps (Hydrochlorothiazide) .... Once Daily 5)  Aspirin Ec 325 Mg Tbec (Aspirin) .... Once Daily 6)  Multivitamins  Tabs (Multiple Vitamin) .... Once Daily 7)  Garlic 1000 Mg Caps (Garlic) .... Once Daily 8)  Vitamin D 400 Unit Caps (Cholecalciferol) .... Once Daily 9)  Calcium 600/vitamin D 600-400 Mg-Unit Tabs (Calcium  Carbonate-Vitamin D) .... One Two Times A Day 10)  Alendronate Sodium 70 Mg Tabs (Alendronate Sodium) .... One Tablet Once Weekly 11)  Fluoxetine Hcl 20 Mg Caps (Fluoxetine Hcl) .... Take 1 Capsule By Mouth Once A Day  Allergies (verified): No Known Drug Allergies  Review of Systems      See HPI General:  Complains of fatigue. Eyes:  Denies eye pain and red eye. Endo:  Denies excessive hunger and excessive thirst. Heme:  Denies abnormal bruising and bleeding. Allergy:  Denies hives or rash and itching eyes.  Physical Exam  General:  Well-developed,well-nourished,in no acute distress; alert,appropriate and cooperative throughout examination HEENT: No facial asymmetry,  EOMI, No sinus tenderness, TM's Clear, oropharynx  pink and moist.   Chest: Clear to auscultation bilaterally.  CVS: S1, S2, No murmurs, No S3.   Abd: Soft, Nontender.  MS: Adequate ROM spine, hips, shoulders and knees.  Ext: No edema.   CNS: CN 2-12 intact, power tone and sensation normal throughout.   Skin: Intact, no visible lesions or rashes.  Psych: Good eye contact, normal affect.  Memory intact, not anxious or depressed appearing.    Impression & Recommendations:  Problem # 1:  DEPRESSION (ICD-311) Assessment Improved  Her updated medication list for this problem includes:    Fluoxetine Hcl 20 Mg Caps (Fluoxetine hcl) .Marland Kitchen... Take 1 capsule by mouth once a day  Problem # 2:  HYPERTENSION (ICD-401.9) Assessment: Improved  Her updated medication list for  this problem includes:    Metoprolol Tartrate 50 Mg Tabs (Metoprolol tartrate) ..... Once daily    Lisinopril 40 Mg Tabs (Lisinopril) ..... One daily    Hydrochlorothiazide 12.5 Mg Caps (Hydrochlorothiazide) ..... Once daily  Orders: T-Basic Metabolic Panel ([id number])  BP today: 124/78 Prior BP: 138/78 (12/06/2009)  Prior 10 Yr Risk Heart Disease: N/A (07/05/2008)  Labs Reviewed: K+: 3.9 (02/07/2010) Creat: : 0.98 (02/07/2010)   Chol: 146  (02/07/2010)   HDL: 49 (02/07/2010)   LDL: 74 (02/07/2010)   TG: 116 (02/07/2010)  Problem # 3:  HYPERLIPIDEMIA (ICD-272.4) Assessment: Comment Only  Her updated medication list for this problem includes:    Simvastatin 80 Mg Tabs (Simvastatin) ..... One daily  Orders: T-Hepatic Function [id number]) T-Lipid Profile [phone])  Labs Reviewed: SGOT: 13 (02/07/2010)   SGPT: 17 (02/07/2010)  Lipid Goals: Chol Goal: 200 (08/03/2008)   HDL Goal: 40 (08/03/2008)   LDL Goal: 100 (08/03/2008)   TG Goal: 150 (08/03/2008)  Prior 10 Yr Risk Heart Disease: N/A (07/05/2008)   HDL:49 (02/07/2010), 49 (08/03/2009)  LDL:74 (02/07/2010), 80 (08/03/2009)  Chol:146 (02/07/2010), 158 (08/03/2009)  Trig:116 (02/07/2010), 144 (08/03/2009)  Complete Medication List: 1)  Simvastatin 80 Mg Tabs (Simvastatin) .... One daily 2)  Metoprolol Tartrate 50 Mg Tabs (Metoprolol tartrate) .... Once daily 3)  Lisinopril 40 Mg Tabs (Lisinopril) .... One daily 4)  Hydrochlorothiazide 12.5 Mg Caps (Hydrochlorothiazide) .... Once daily 5)  Aspirin Ec 325 Mg Tbec (Aspirin) .... Once daily 6)  Multivitamins Tabs (Multiple vitamin) .... Once daily 7)  Garlic 1000 Mg Caps (Garlic) .... Once daily 8)  Vitamin D 400 Unit Caps (Cholecalciferol) .... Once daily 9)  Calcium 600/vitamin D 600-400 Mg-unit Tabs (Calcium carbonate-vitamin d) .... One two times a day 10)  Alendronate Sodium 70 Mg Tabs (Alendronate sodium) .... One tablet once weekly 11)  Fluoxetine Hcl 20 Mg Caps (Fluoxetine hcl) .... Take 1 capsule by mouth once a day  Other Orders: T-CBC w/Diff ([id number]) T-TSH [id number]) T- Hemoglobin A1C (22025-42706)  Patient Instructions: 1)  F/U in mid January. 2)  BMP prior to visit, ICD-9: 3)  Hepatic Panel prior to visit, ICD-9: 4)  Lipid Panel prior to visit, ICD-9:   fasting in mid January. 5)  TSH prior to visit, ICD-9: 6)  HbgA1C prior to visit, ICD-9: 7)  Congrats on weight loss. 8)  Pls start  again  to go to the [employer] 9)  No med changes. 10)  Pls cut back on carbs and sweets Prescriptions: FLUOXETINE HCL 20 MG CAPS (FLUOXETINE HCL) Take 1 capsule by mouth once a day  #90 x 3   Entered by:   Adella Hare LPN   Authorized by:   Syliva Overman MD   Signed by:   Adella Hare LPN on 23/76/2831   Method used:   Electronically to        [facility]  [address] (retail)       [address]       [place], [place]  [address]       Ph: [phone]       Fax: [phone]   RxID:   [id number] METOPROLOL TARTRATE 50 MG TABS (METOPROLOL TARTRATE) once daily  #90 Each x 3   Entered by:   Adella Hare LPN   Authorized by:   Syliva Overman MD   Signed by:   Adella Hare LPN on 67/89/3810   Method used:   Electronically to  [facility]  [address] (retail)       [address]       [place], [place]  [address]       Ph: [phone]       Fax: [phone]   RxID:   [id number]

## 2010-08-08 NOTE — Assessment & Plan Note (Signed)
Summary: office visit   Vital Signs:  Patient profile:   65 year old female Menstrual status:  postmenopausal Height:      62 inches Weight:      149.75 pounds BMI:     27.49 O2 Sat:      97 % Pulse rate:   71 / minute Pulse rhythm:   regular Resp:     16 per minute BP sitting:   122 / 74  (left arm) Cuff size:   regular  Vitals Entered By: Everitt Amber LPN (July 18, 2010 10:58 AM)  Nutrition Counseling: Patient's BMI is greater than 25 and therefore counseled on weight management options. CC: Follow up chronic problems   Primary Care Provider:  Syliva Overman MD  CC:  Follow up chronic problems.  History of Present Illness: Reports  that she has been doing fairly well. Much improved since resuming her antidepressants Denies recent fever or chills. Denies sinus pressure, nasal congestion , ear pain or sore throat. Denies chest congestion, or cough productive of sputum. Denies chest pain, palpitations, PND, orthopnea or leg swelling. Denies abdominal pain, nausea, vomitting, diarrhea or constipation. Denies change in bowel movements or bloody stool. Denies dysuria , frequency, incontinence or hesitancy. Denies  joint pain, swelling, or reduced mobility. Denies headaches, vertigo, seizures. Denies depression, anxiety or insomnia. Denies  rash, lesions, or itch.     Current Medications (verified): 1)  Simvastatin 80 Mg Tabs (Simvastatin) .... One Daily 2)  Metoprolol Tartrate 50 Mg Tabs (Metoprolol Tartrate) .... Once Daily 3)  Lisinopril 40 Mg Tabs (Lisinopril) .... One Daily 4)  Hydrochlorothiazide 12.5 Mg Caps (Hydrochlorothiazide) .... Once Daily 5)  Aspirin Ec 325 Mg Tbec (Aspirin) .... Once Daily 6)  Multivitamins  Tabs (Multiple Vitamin) .... Once Daily 7)  Garlic 1000 Mg Caps (Garlic) .... Once Daily 8)  Calcium 600/vitamin D 600-400 Mg-Unit Tabs (Calcium Carbonate-Vitamin D) .... One Two Times A Day 9)  Alendronate Sodium 70 Mg Tabs (Alendronate Sodium)  .... One Tablet Once Weekly 10)  Fluoxetine Hcl 20 Mg Caps (Fluoxetine Hcl) .... Take 1 Capsule By Mouth Once A Day  Allergies (verified): No Known Drug Allergies  Review of Systems      See HPI Eyes:  Denies discharge, eye pain, and red eye. Psych:  Complains of anxiety, depression, and mental problems; denies suicidal thoughts/plans, thoughts of violence, and unusual visions or sounds; improved and controlled. Endo:  Denies cold intolerance, excessive hunger, excessive thirst, excessive urination, and heat intolerance. Heme:  Denies abnormal bruising and bleeding. Allergy:  Denies hives or rash and itching eyes.  Physical Exam  General:  Well-developed,well-nourished,in no acute distress; alert,appropriate and cooperative throughout examination HEENT: No facial asymmetry,  EOMI, No sinus tenderness, TM's Clear, oropharynx  pink and moist.   Chest: Clear to auscultation bilaterally.  CVS: S1, S2, No murmurs, No S3.   Abd: Soft, Nontender.  MS: Adequate ROM spine, hips, shoulders and knees.  Ext: No edema.   CNS: CN 2-12 intact, power tone and sensation normal throughout.   Skin: Intact, no visible lesions or rashes.  Psych: Good eye contact, normal affect.  Memory intact, not anxious or depressed appearing.    Impression & Recommendations:  Problem # 1:  HYPERTENSION (ICD-401.9) Assessment Unchanged  Her updated medication list for this problem includes:    Metoprolol Tartrate 50 Mg Tabs (Metoprolol tartrate) ..... Once daily    Lisinopril 40 Mg Tabs (Lisinopril) ..... One daily    Hydrochlorothiazide 12.5 Mg Caps (Hydrochlorothiazide) .Marland KitchenMarland KitchenMarland KitchenMarland Kitchen  Once daily  BP today: 122/74 Prior BP: 124/78 (02/27/2010)  Prior 10 Yr Risk Heart Disease: N/A (07/05/2008)  Labs Reviewed: K+: 4.2 (07/10/2010) Creat: : 0.91 (07/10/2010)   Chol: 161 (07/10/2010)   HDL: 44 (07/10/2010)   LDL: 92 (07/10/2010)   TG: 126 (07/10/2010)  Problem # 2:  HYPERLIPIDEMIA (ICD-272.4) Assessment:  Unchanged  The following medications were removed from the medication list:    Simvastatin 80 Mg Tabs (Simvastatin) ..... One daily Her updated medication list for this problem includes:    Simvastatin 40 Mg Tabs (Simvastatin) .Marland Kitchen... Take 1 tab by mouth at bedtime , pls take half of the 80mg  tablet till done Low fat dietdiscussed and encouraged  Orders: Medicare Electronic Prescription [id number]) T-Lipid Profile [id number]) T-Hepatic Function [id number])  Labs Reviewed: SGOT: 13 (07/10/2010)   SGPT: 18 (07/10/2010)  Lipid Goals: Chol Goal: 200 (08/03/2008)   HDL Goal: 40 (08/03/2008)   LDL Goal: 100 (08/03/2008)   TG Goal: 150 (08/03/2008)  Prior 10 Yr Risk Heart Disease: N/A (07/05/2008)   HDL:44 (07/10/2010), 49 (02/07/2010)  LDL:92 (07/10/2010), 74 (02/07/2010)  Chol:161 (07/10/2010), 146 (02/07/2010)  Trig:126 (07/10/2010), 116 (02/07/2010)  Problem # 3:  DEPRESSION (ICD-311) Assessment: Improved  Her updated medication list for this problem includes:    Fluoxetine Hcl 20 Mg Caps (Fluoxetine hcl) .Marland Kitchen... Take 1 capsule by mouth once a day  Problem # 4:  ALLERGIC RHINITIS (ICD-477.9)  Complete Medication List: 1)  Metoprolol Tartrate 50 Mg Tabs (Metoprolol tartrate) .... Once daily 2)  Lisinopril 40 Mg Tabs (Lisinopril) .... One daily 3)  Hydrochlorothiazide 12.5 Mg Caps (Hydrochlorothiazide) .... Once daily 4)  Aspirin Ec 325 Mg Tbec (Aspirin) .... Once daily 5)  Multivitamins Tabs (Multiple vitamin) .... Once daily 6)  Garlic 1000 Mg Caps (Garlic) .... Once daily 7)  Calcium 600/vitamin D 600-400 Mg-unit Tabs (Calcium carbonate-vitamin d) .... One two times a day 8)  Alendronate Sodium 70 Mg Tabs (Alendronate sodium) .... One tablet once weekly 9)  Fluoxetine Hcl 20 Mg Caps (Fluoxetine hcl) .... Take 1 capsule by mouth once a day 10)  Simvastatin 40 Mg Tabs (Simvastatin) .... Take 1 tab by mouth at bedtime , pls take half of the 80mg  tablet till done  Other  Orders: T-Basic Metabolic Panel [id number]) T- Hemoglobin A1C ([id number])  Patient Instructions: 1)  Please schedule a follow-up appointment in 4 months. 2)  It is important that you exercise regularly at least 20 minutes 5 times a week. If you develop chest pain, have severe difficulty breathing, or feel very tired , stop exercising immediately and seek medical attention. 3)  You need to lose weight. Consider a lower calorie diet and regular exercise.  4)  Pls reduce your sugar and sweet intake, 5)  Your blood sugars are running higher than they should , pLs aim to stop sodas. 6)  Dose reduction in simvastatin to 40 mg one at night, ok yo take 80mg  half at night until done. 7)  BMP prior to visit, ICD-9: 8)  Hepatic Panel prior to visit, ICD-9: 9)  Lipid Panel prior to visit, ICD-9:   fasting in 4 months 10)  HbgA1C prior to visit, ICD-9: Prescriptions: HYDROCHLOROTHIAZIDE 12.5 MG CAPS (HYDROCHLOROTHIAZIDE) once daily  #90 Each x 0   Entered by:   Adella Hare LPN   Authorized by:   Syliva Overman MD   Signed by:   Adella Hare LPN on 02/72/5366   Method used:   Electronically to  [facility]  [address] [address] [facility] (retail)       [address]       [place], [place]  [address]       Ph: [phone]       Fax: [phone]   RxID:   [id number] SIMVASTATIN 40 MG TABS (SIMVASTATIN) Take 1 tab by mouth at bedtime , pls take half of the 80mg  tablet till done  #90 x 1   Entered by:   Adella Hare LPN   Authorized by:   Syliva Overman MD   Signed by:   Adella Hare LPN on 25/36/6440   Method used:   Electronically to        [facility]  [address] (retail)       [address]       [place], [place]  [address]       Ph: [phone]       Fax: [phone]   RxID:   [id number]    Orders Added: 1)  Est. Patient Level IV [57322] 2)  Medicare Electronic Prescription [G8553] 3)  T-Basic Metabolic Panel [[id number]] 4)  T-Lipid Profile  [80061-22930] 5)  T-Hepatic Function [80076-22960] 6)  T- Hemoglobin A1C [83036-23375]

## 2010-08-27 ENCOUNTER — Ambulatory Visit (HOSPITAL_COMMUNITY)
Admission: RE | Admit: 2010-08-27 | Discharge: 2010-08-27 | Disposition: A | Discharge status: Home / Self Care | Payer: Medicare HMO | Source: Ambulatory Visit | Attending: Family Medicine | Admitting: Family Medicine

## 2010-08-27 ENCOUNTER — Ambulatory Visit (HOSPITAL_COMMUNITY): Payer: Medicare HMO

## 2010-08-27 DIAGNOSIS — Z139 Encounter for screening, unspecified: Secondary | ICD-10-CM

## 2010-08-27 DIAGNOSIS — Z1231 Encounter for screening mammogram for malignant neoplasm of breast: Secondary | ICD-10-CM | POA: Insufficient documentation

## 2010-09-04 ENCOUNTER — Encounter: Payer: Self-pay | Admitting: *Deleted

## 2010-09-27 ENCOUNTER — Encounter: Payer: Self-pay | Admitting: Cardiology

## 2010-09-27 ENCOUNTER — Ambulatory Visit (INDEPENDENT_AMBULATORY_CARE_PROVIDER_SITE_OTHER): Payer: Medicare HMO | Admitting: Cardiology

## 2010-09-27 DIAGNOSIS — I2510 Atherosclerotic heart disease of native coronary artery without angina pectoris: Secondary | ICD-10-CM

## 2010-09-27 DIAGNOSIS — I10 Essential (primary) hypertension: Secondary | ICD-10-CM

## 2010-09-27 DIAGNOSIS — E785 Hyperlipidemia, unspecified: Secondary | ICD-10-CM

## 2010-09-27 NOTE — Progress Notes (Signed)
HPI :  Patient returns as scheduled for continued assessment and treatment of coronary artery disease and vascular risk factors.  Since her last visit, she is doing quite well with no chest pain or dyspnea.  She denies orthopnea, PND and syncope.  She has experienced no new medical problems nor has she required urgent medical attention or hospitalization.  Current Outpatient Prescriptions on File Prior to Visit  Medication Sig Dispense Refill  . alendronate (FOSAMAX) 70 MG tablet Take 70 mg by mouth every 7 (seven) days. Take with a full glass of water on an empty stomach.       Marland Kitchen aspirin 325 MG tablet Take 325 mg by mouth daily.        . calcium carbonate (OS-CAL) 600 MG TABS Take 600 mg by mouth 2 (two) times daily with a meal.        . FLUoxetine (PROZAC) 20 MG tablet Take 20 mg by mouth daily.        . Garlic 1000 MG CAPS Take by mouth.        . hydrochlorothiazide (HYDRODIURIL) 12.5 MG tablet Take 12.5 mg by mouth daily.        Marland Kitchen lisinopril (PRINIVIL,ZESTRIL) 40 MG tablet Take 40 mg by mouth daily.        . Multiple Vitamins-Minerals (MULTIVITAMIN WITH MINERALS) tablet Take 1 tablet by mouth daily.        . simvastatin (ZOCOR) 40 MG tablet Take 40 mg by mouth at bedtime.        Marland Kitchen DISCONTD: Garlic 100 MG TABS Take by mouth.        . DISCONTD: metoprolol (TOPROL-XL) 50 MG 24 hr tablet Take 50 mg by mouth daily.           No Known Allergies    Past medical history, social history, and family history reviewed and updated.  ROS: See history of present illness.  PHYSICAL EXAM:  BP 121/62  Pulse 69  Ht 5\' 2"  (1.575 m)  Wt 148 lb (67.132 kg)  BMI 27.07 kg/m2  SpO2 95%  General-Well developed; no acute distress Body habitus-proportionate weight and height Neck-No JVD; no carotid bruits Lungs-clear lung fields; resonant to percussion Cardiovascular-normal PMI; normal S1 and S2; S4 present; modest systolic ejection murmur Abdomen-normal bowel sounds; soft and non-tender without masses  or organomegaly Musculoskeletal-No deformities, no cyanosis or clubbing Neurologic-Normal cranial nerves; symmetric strength and tone Skin-Warm, multiple seborrheic keratoses Extremities-distal pulses intact; no edema   ASSESSMENT AND PLAN:

## 2010-09-27 NOTE — Assessment & Plan Note (Addendum)
Patient remains asymptomatic without any evidence to suggest recurrent myocardial ischemia.  Dr. Lodema Hong will continue to monitor appropriate laboratory studies and blood pressure as she has so ably done in the past.

## 2010-09-27 NOTE — Assessment & Plan Note (Signed)
Blood pressure control has been good with current medical regimen, which will be continued.

## 2010-09-27 NOTE — Assessment & Plan Note (Signed)
Recent lipid profile was quite good; current therapy is adequate.

## 2010-10-26 ENCOUNTER — Other Ambulatory Visit: Payer: Self-pay | Admitting: Family Medicine

## 2010-10-26 LAB — HEPATIC FUNCTION PANEL
Alkaline Phosphatase: 46 U/L (ref 39–117)
Bilirubin, Direct: 0.2 mg/dL (ref 0.0–0.3)
Indirect Bilirubin: 0.8 mg/dL (ref 0.0–0.9)
Total Bilirubin: 1 mg/dL (ref 0.3–1.2)
Total Protein: 6.9 g/dL (ref 6.0–8.3)

## 2010-10-26 LAB — LIPID PANEL
LDL Cholesterol: 90 mg/dL (ref 0–99)
VLDL: 16 mg/dL (ref 0–40)

## 2010-10-26 LAB — HEMOGLOBIN A1C: Mean Plasma Glucose: 126 mg/dL — ABNORMAL HIGH (ref <117)

## 2010-10-26 LAB — BASIC METABOLIC PANEL
BUN: 12 mg/dL (ref 6–23)
CO2: 28 mEq/L (ref 19–32)
Chloride: 100 mEq/L (ref 96–112)
Creat: 0.89 mg/dL (ref 0.40–1.20)
Glucose, Bld: 104 mg/dL — ABNORMAL HIGH (ref 70–99)
Potassium: 4 mEq/L (ref 3.5–5.3)

## 2010-11-12 ENCOUNTER — Encounter: Payer: Self-pay | Admitting: Family Medicine

## 2010-11-13 NOTE — Letter (Signed)
August 16, 2008    Franchot Heidelberg, MD  [address]  [place], [place]  [address]   RE:  Angela Lambert, Angela Lambert  MRN:  [id number]  /  DOB:  Aug 18, 1945   Dear Angela Lambert:   It was my pleasure evaluating Angela Lambert in consultation in the office  today at your request for a history of coronary artery disease.  As you  know, this nice woman presented with acute myocardial infarction in 2003  requiring stent implantation.  She is unaware of the location of her  infarct, the vessel treated or of her residual left ventricular systolic  function.  She has had no subsequent symptoms attributable to  cardiovascular disease.   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  She had a prolonged period of coma, but eventually recovered  with a residual left hemiparesis and visual disturbance.  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   She has had hypertension that has been well treated medically.   CURRENT MEDICATIONS:  1. HCTZ 12.5 mg daily.  2. Lisinopril 40 mg daily.  3. Metoprolol 50 mg daily.  4. Simvastatin 80 mg daily.  5. Fluoxetine 20 mg daily.   She has no known drug allergies.   SOCIAL HISTORY:  Divorced with 2 children.  No use of tobacco products  nor alcohol.   FAMILY HISTORY:  Sketchy; mother died with renal disease.  She has 1  sibling who is alive and well.   REVIEW OF SYSTEMS:  Are notable for the need for corrective lenses,  occasional urinary incontinence.  All other systems reviewed and are  negative.   PHYSICAL EXAMINATION:  GENERAL:  Pleasant woman in no acute distress.  VITAL SIGNS:  The weight is 155.  Blood pressure 145/55, heart rate 56  and regular, respirations 12 and unlabored.  HEENT:  Anicteric sclerae; normal lids and conjunctivae; normal oral  mucosa.  NECK:  No jugular venous distention; normal carotid upstrokes without   bruits.  ENDOCRINE:  No thyromegaly.  HEMATOPOIETIC:  No adenopathy.  SKIN:  No significant lesions.  LUNGS:  Clear.  CARDIAC:  Normal first heart sounds; prominent splitting of the second  heart sounds; minimal systolic ejection murmur.  ABDOMEN:  Soft and nontender; no organomegaly.  EXTREMITIES:  No edema; normal distal pulses.  NEUROLOGIC:  Symmetric strength and tone; normal cranial nerves.   EKG:  Normal sinus rhythm; left bundle-branch block; left axis.  No  prior tracing for comparison.   IMPRESSION:  Angela Lambert is doing very well, 7 years following acute  myocardial infarction.  You are in the process of optimizing treatment  for hypertension and hyperlipidemia.  She has not and does not use  tobacco products.  She has no history of diabetes.  Current medical  therapy is appropriate except for the absence of aspirin.  We will  verify that she is actually taking this medication.  She was taking  clopidogrel for 6 months after a stent was placed, but has done fine  without it for many years.  An echocardiogram will be obtained to  evaluate left ventricular systolic function.  I have reviewed your  office records and we will secure her records from her previous  cardiologist.  I will see this nice woman again in 6 months and then  anticipate visits at 1 year intervals.  Thank you so much for  sending  her to me and please let me know at any time that I can assist with her  care.    Sincerely,      Gerrit Friends. Dietrich Pates, MD, [facility]  Electronically Signed    RMR/MedQ  DD: 08/16/2008  DT: 08/17/2008  Job #: [id number]

## 2010-11-20 ENCOUNTER — Encounter: Payer: Self-pay | Admitting: Family Medicine

## 2010-11-20 ENCOUNTER — Ambulatory Visit (INDEPENDENT_AMBULATORY_CARE_PROVIDER_SITE_OTHER): Payer: Medicare HMO | Admitting: Family Medicine

## 2010-11-20 VITALS — BP 130/80 | HR 50 | Resp 16 | Ht 62.0 in | Wt 145.4 lb

## 2010-11-20 DIAGNOSIS — I10 Essential (primary) hypertension: Secondary | ICD-10-CM

## 2010-11-20 DIAGNOSIS — R7301 Impaired fasting glucose: Secondary | ICD-10-CM

## 2010-11-20 DIAGNOSIS — E785 Hyperlipidemia, unspecified: Secondary | ICD-10-CM

## 2010-11-20 DIAGNOSIS — F329 Major depressive disorder, single episode, unspecified: Secondary | ICD-10-CM

## 2010-11-20 MED ORDER — LISINOPRIL 40 MG PO TABS: 40.0000 mg | ORAL_TABLET | Freq: Every day | ORAL | Status: DC

## 2010-11-20 MED ORDER — METOPROLOL TARTRATE 50 MG PO TABS: 50.0000 mg | ORAL_TABLET | Freq: Every day | ORAL | Status: DC

## 2010-11-20 MED ORDER — SIMVASTATIN 40 MG PO TABS: 40.0000 mg | ORAL_TABLET | Freq: Every day | ORAL | Status: DC

## 2010-11-20 MED ORDER — HYDROCHLOROTHIAZIDE 12.5 MG PO TABS: 12.5000 mg | ORAL_TABLET | Freq: Every day | ORAL | Status: DC

## 2010-11-20 NOTE — Patient Instructions (Signed)
F/u in 4 months.  It is important that you exercise regularly at least 30 minutes 5 times a week. If you develop chest pain, have severe difficulty breathing, or feel very tired, stop exercising immediately and seek medical attention   A healthy diet is rich in fruit, vegetables and whole grains. Poultry fish, nuts and beans are a healthy choice for protein rather then red meat. A low sodium diet and drinking 64 ounces of water daily is generally recommended. Oils and sweet should be limited. Carbohydrates especially for those who are diabetic or overweight, should be limited to 34-45 gram per meal. It is important to eat on a regular schedule, at least 3 times daily. Snacks should be primarily fruits, vegetables or nuts.   You have improved, pls keep it up.  HBA1C  And chem 7 in 4 months, non fasting

## 2010-11-25 ENCOUNTER — Encounter: Payer: Self-pay | Admitting: Family Medicine

## 2010-11-25 NOTE — Assessment & Plan Note (Signed)
Controlled, no change in medication  

## 2010-11-25 NOTE — Progress Notes (Signed)
  Subjective:    Patient ID: Angela Lambert, female    DOB: 24-Feb-1946, 65 y.o.   MRN: [id number]  HPI The PT is here for follow up and re-evaluation of chronic medical conditions, medication management and review of recent lab and radiology data.  Preventive health is updated, specifically  Cancer screening, Osteoporosis screening and Immunization.   Questions or concerns regarding consultations or procedures which the PT has had in the interim are  addressed. The PT denies any adverse reactions to current medications since the last visit.  There are no new concerns.  There are no specific complaints       Review of Systems Denies recent fever or chills. Denies sinus pressure, nasal congestion, ear pain or sore throat. Denies chest congestion, productive cough or wheezing. Denies chest pains, palpitations, paroxysmal nocturnal dyspnea, orthopnea and leg swelling Denies abdominal pain, nausea, vomiting,diarrhea or constipation.  Denies rectal bleeding or change in bowel movement. Denies dysuria, frequency, hesitancy or incontinence. Denies joint pain, swelling and limitation in mobility. Denies headaches, seizure, numbness, or tingling. Denies depression, anxiety or insomnia. Denies skin break down or rash.        Objective:   Physical Exam    Patient alert and oriented and in no Cardiopulmonary distress.  HEENT: No facial asymmetry, EOMI, no sinus tenderness, TM's clear, Oropharynx pink and moist.  Neck supple no adenopathy.  Chest: Clear to auscultation bilaterally.  CVS: S1, S2 no murmurs, no S3.  ABD: Soft non tender. Bowel sounds normal.  Ext: No edema  MS: Adequate ROM spine, shoulders, hips and knees.  Skin: Intact, no ulcerations or rash noted.  Psych: Good eye contact, normal affect. Memory loss not anxious or depressed appearing.  CNS: CN 2-12 intact.   Assessment & Plan:

## 2010-12-25 ENCOUNTER — Other Ambulatory Visit: Payer: Self-pay | Admitting: Family Medicine

## 2011-03-06 ENCOUNTER — Other Ambulatory Visit: Payer: Self-pay | Admitting: Family Medicine

## 2011-03-14 LAB — BASIC METABOLIC PANEL
CO2: 24 mEq/L (ref 19–32)
Calcium: 9.4 mg/dL (ref 8.4–10.5)
Glucose, Bld: 94 mg/dL (ref 70–99)
Potassium: 4.2 mEq/L (ref 3.5–5.3)
Sodium: 142 mEq/L (ref 135–145)

## 2011-03-22 ENCOUNTER — Encounter: Payer: Self-pay | Admitting: Family Medicine

## 2011-03-25 ENCOUNTER — Ambulatory Visit (INDEPENDENT_AMBULATORY_CARE_PROVIDER_SITE_OTHER): Payer: Medicare HMO | Admitting: Family Medicine

## 2011-03-25 ENCOUNTER — Encounter: Payer: Self-pay | Admitting: Family Medicine

## 2011-03-25 VITALS — BP 130/62 | HR 101 | Resp 16 | Ht 62.0 in | Wt 146.0 lb

## 2011-03-25 DIAGNOSIS — R5381 Other malaise: Secondary | ICD-10-CM

## 2011-03-25 DIAGNOSIS — R7309 Other abnormal glucose: Secondary | ICD-10-CM

## 2011-03-25 DIAGNOSIS — R7301 Impaired fasting glucose: Secondary | ICD-10-CM

## 2011-03-25 DIAGNOSIS — R7303 Prediabetes: Secondary | ICD-10-CM | POA: Insufficient documentation

## 2011-03-25 DIAGNOSIS — F329 Major depressive disorder, single episode, unspecified: Secondary | ICD-10-CM

## 2011-03-25 DIAGNOSIS — I10 Essential (primary) hypertension: Secondary | ICD-10-CM

## 2011-03-25 DIAGNOSIS — Z23 Encounter for immunization: Secondary | ICD-10-CM

## 2011-03-25 DIAGNOSIS — R5383 Other fatigue: Secondary | ICD-10-CM

## 2011-03-25 DIAGNOSIS — E785 Hyperlipidemia, unspecified: Secondary | ICD-10-CM

## 2011-03-25 MED ORDER — FLUOXETINE HCL 20 MG PO CAPS: 20.0000 mg | ORAL_CAPSULE | Freq: Every day | ORAL | Status: DC

## 2011-03-25 MED ORDER — SIMVASTATIN 40 MG PO TABS: 40.0000 mg | ORAL_TABLET | Freq: Every day | ORAL | Status: DC

## 2011-03-25 MED ORDER — ALENDRONATE SODIUM 70 MG PO TABS: 70.0000 mg | ORAL_TABLET | ORAL | Status: DC

## 2011-03-25 MED ORDER — INFLUENZA VAC TYPES A & B PF IM SUSP: 0.5000 mL | Freq: Once | INTRAMUSCULAR | Status: DC

## 2011-03-25 MED ORDER — METOPROLOL TARTRATE 50 MG PO TABS: 50.0000 mg | ORAL_TABLET | Freq: Every day | ORAL | Status: DC

## 2011-03-25 NOTE — Assessment & Plan Note (Signed)
Hyperlipidemia:Low fat diet discussed and encouraged.  Labs before next visit

## 2011-03-25 NOTE — Assessment & Plan Note (Signed)
Controlled, no change in medication  

## 2011-03-25 NOTE — Assessment & Plan Note (Signed)
Deteriorated, hBA1C has increased, pt to attend education re diet

## 2011-03-25 NOTE — Progress Notes (Signed)
  Subjective:    Patient ID: Angela Lambert, female    DOB: Dec 29, 1945, 65 y.o.   MRN: [id number]  HPI The PT is here for follow up and re-evaluation of chronic medical conditions, medication management and review of any available recent lab and radiology data.  Preventive health is updated, specifically  Cancer screening and Immunization.   Questions or concerns regarding consultations or procedures which the PT has had in the interim are  addressed. The PT denies any adverse reactions to current medications since the last visit.  There are no new concerns.  There are no specific complaints       Review of Systems Denies recent fever or chills. Denies sinus pressure, nasal congestion, ear pain or sore throat. Denies chest congestion, productive cough or wheezing. Denies chest pains, palpitations and leg swelling Denies abdominal pain, nausea, vomiting,diarrhea or constipation.   Denies dysuria, frequency, hesitancy or incontinence. Denies joint pain, swelling and limitation in mobility. Denies headaches, seizures, numbness, or tingling. Denies depression, anxiety or insomnia. Denies skin break down or rash.        Objective:   Physical Exam   Patient alert and oriented and in no cardiopulmonary distress.  HEENT: No facial asymmetry, EOMI, no sinus tenderness,  oropharynx pink and moist.  Neck supple no adenopathy.  Chest: Clear to auscultation bilaterally.  CVS: S1, S2 no murmurs, no S3.  ABD: Soft non tender. Bowel sounds normal.  Ext: No edema  MS: Adequate ROM spine, shoulders, hips and knees.  Skin: Intact, no ulcerations or rash noted.  Psych: Good eye contact, normal affect. Memory intact not anxious or depressed appearing.  CNS: CN 2-12 intact, power, tone and sensation normal throughout.      Assessment & Plan:

## 2011-03-25 NOTE — Patient Instructions (Signed)
CPE   January 14 or after.  Flu vaccine today.   Mammogram due next February.  Blood sugars have increased, PLEASE register and attend a free group session at the hospital on healthy diet and carbohydrate counting.  LABWORK  NEEDS TO BE DONE BETWEEN 3 TO 7 DAYS BEFORE YOUR NEXT SCEDULED  VISIT.  THIS WILL IMPROVE THE QUALITY OF YOUR CARE.   Pls call if you need me before  Anticipate colonoscopy next year  pls check with your insurance re coverage for shingles

## 2011-07-17 ENCOUNTER — Encounter: Payer: Self-pay | Admitting: Family Medicine

## 2011-07-19 LAB — CBC WITH DIFFERENTIAL/PLATELET
Eosinophils Relative: 2 % (ref 0–5)
Hemoglobin: 13.1 g/dL (ref 12.0–15.0)
Lymphocytes Relative: 37 % (ref 12–46)
Lymphs Abs: 2.2 10*3/uL (ref 0.7–4.0)
MCH: 28.7 pg (ref 26.0–34.0)
MCV: 86.8 fL (ref 78.0–100.0)
Monocytes Relative: 7 % (ref 3–12)
Neutrophils Relative %: 54 % (ref 43–77)
Platelets: 202 10*3/uL (ref 150–400)
RBC: 4.56 MIL/uL (ref 3.87–5.11)
WBC: 6.1 10*3/uL (ref 4.0–10.5)

## 2011-07-19 LAB — LIPID PANEL
Cholesterol: 156 mg/dL (ref 0–200)
HDL: 47 mg/dL (ref >39)
Total CHOL/HDL Ratio: 3.3 Ratio
VLDL: 15 mg/dL (ref 0–40)

## 2011-07-19 LAB — HEMOGLOBIN A1C
Hgb A1c MFr Bld: 6 % — ABNORMAL HIGH (ref <5.7)
Mean Plasma Glucose: 126 mg/dL — ABNORMAL HIGH (ref <117)

## 2011-07-19 LAB — BASIC METABOLIC PANEL
Calcium: 9.3 mg/dL (ref 8.4–10.5)
Potassium: 4.2 mEq/L (ref 3.5–5.3)
Sodium: 144 mEq/L (ref 135–145)

## 2011-07-19 LAB — HEPATIC FUNCTION PANEL
ALT: 17 U/L (ref 0–35)
AST: 13 U/L (ref 0–37)
Albumin: 4.5 g/dL (ref 3.5–5.2)

## 2011-07-22 ENCOUNTER — Ambulatory Visit (INDEPENDENT_AMBULATORY_CARE_PROVIDER_SITE_OTHER): Payer: Self-pay | Admitting: Family Medicine

## 2011-07-22 ENCOUNTER — Encounter: Payer: Self-pay | Admitting: Family Medicine

## 2011-07-22 ENCOUNTER — Other Ambulatory Visit (HOSPITAL_COMMUNITY)
Admission: RE | Admit: 2011-07-22 | Discharge: 2011-07-22 | Disposition: A | Discharge status: Home / Self Care | Payer: Medicare Other | Source: Ambulatory Visit | Attending: Family Medicine | Admitting: Family Medicine

## 2011-07-22 VITALS — BP 118/62 | HR 64 | Resp 16 | Ht 62.0 in | Wt 144.8 lb

## 2011-07-22 DIAGNOSIS — Z01419 Encounter for gynecological examination (general) (routine) without abnormal findings: Secondary | ICD-10-CM | POA: Insufficient documentation

## 2011-07-22 DIAGNOSIS — Z0000 Encounter for general adult medical examination without abnormal findings: Secondary | ICD-10-CM

## 2011-07-22 DIAGNOSIS — R5383 Other fatigue: Secondary | ICD-10-CM

## 2011-07-22 DIAGNOSIS — Z1211 Encounter for screening for malignant neoplasm of colon: Secondary | ICD-10-CM

## 2011-07-22 DIAGNOSIS — R7309 Other abnormal glucose: Secondary | ICD-10-CM

## 2011-07-22 DIAGNOSIS — E785 Hyperlipidemia, unspecified: Secondary | ICD-10-CM

## 2011-07-22 DIAGNOSIS — I10 Essential (primary) hypertension: Secondary | ICD-10-CM

## 2011-07-22 DIAGNOSIS — R7303 Prediabetes: Secondary | ICD-10-CM

## 2011-07-22 LAB — POC HEMOCCULT BLD/STL (OFFICE/1-CARD/DIAGNOSTIC): Fecal Occult Blood, POC: NEGATIVE

## 2011-07-22 MED ORDER — FLUOXETINE HCL 20 MG PO CAPS: 20.0000 mg | ORAL_CAPSULE | Freq: Every day | ORAL | Status: DC

## 2011-07-22 MED ORDER — HYDROCHLOROTHIAZIDE 12.5 MG PO TABS: 12.5000 mg | ORAL_TABLET | Freq: Every day | ORAL | Status: DC

## 2011-07-22 MED ORDER — SIMVASTATIN 40 MG PO TABS: 40.0000 mg | ORAL_TABLET | Freq: Every day | ORAL | Status: DC

## 2011-07-22 MED ORDER — METOPROLOL TARTRATE 50 MG PO TABS: 50.0000 mg | ORAL_TABLET | Freq: Every day | ORAL | Status: DC

## 2011-07-22 NOTE — Patient Instructions (Addendum)
F/U in 5.5 months.  HBa1C and chem 7, lipid and hepatic fadsting in 5.5 months   You will be referred for colonoscopy.  Please commit to 30 minutes of exercise at least 5 days per week.  Aim for 3 to 5 pound weight loss, this will reduce your risk of becoming diabetic.  Pls call your ins company about coverage for the shingles vaccine, we have it, you need it

## 2011-07-24 ENCOUNTER — Telehealth: Payer: Self-pay

## 2011-07-24 ENCOUNTER — Other Ambulatory Visit: Payer: Self-pay

## 2011-07-24 DIAGNOSIS — Z139 Encounter for screening, unspecified: Secondary | ICD-10-CM

## 2011-07-24 NOTE — Telephone Encounter (Signed)
Gastroenterology Pre-Procedure Form    Request Date: 08/05/2011          Requesting Physician: Dr. Lodema Hong     PATIENT INFORMATION:  Angela Lambert is a 66 y.o., female (DOB=03/24/1946).  PROCEDURE: Procedure(s) requested: colonoscopy Procedure Reason: screening for colon cancer  PATIENT REVIEW QUESTIONS: The patient reports the following:   1. Diabetes Melitis: no 2. Joint replacements in the past 12 months: no 3. Major health problems in the past 3 months: no 4. Has an artificial valve or MVP:no 5. Has been advised in past to take antibiotics in advance of a procedure like teeth cleaning: no}    MEDICATIONS & ALLERGIES:    Patient reports the following regarding taking any blood thinners:   Plavix? no Aspirin?no Coumadin?  no  Patient confirms/reports the following medications:  Current Outpatient Prescriptions  Medication Sig Dispense Refill  . alendronate (FOSAMAX) 70 MG tablet Take 1 tablet (70 mg total) by mouth every 7 (seven) days. Take with a full glass of water on an empty stomach.  12 tablet  1  . aspirin 81 MG tablet Take 81 mg by mouth daily.        . calcium carbonate (OS-CAL) 600 MG TABS Take 600 mg by mouth 2 (two) times daily with a meal.        . FLUoxetine (PROZAC) 20 MG capsule Take 1 capsule (20 mg total) by mouth daily.  90 capsule  1  . Garlic 1000 MG CAPS Take by mouth.        . hydrochlorothiazide (HYDRODIURIL) 12.5 MG tablet Take 1 tablet (12.5 mg total) by mouth daily.  90 tablet  1  . lisinopril (PRINIVIL,ZESTRIL) 40 MG tablet Take 1 tablet (40 mg total) by mouth daily.  90 tablet  3  . metoprolol (LOPRESSOR) 50 MG tablet Take 1 tablet (50 mg total) by mouth daily.  90 tablet  1  . Multiple Vitamins-Minerals (MULTIVITAMIN WITH MINERALS) tablet Take 1 tablet by mouth daily.        . simvastatin (ZOCOR) 40 MG tablet Take 1 tablet (40 mg total) by mouth at bedtime.  90 tablet  1   Current Facility-Administered Medications  Medication Dose Route  Frequency Provider Last Rate Last Dose  . Influenza (>/= 3 years) inactive virus vaccine (FLVIRIN/FLUZONE) injection SUSP 0.5 mL  0.5 mL Intramuscular Once Syliva Overman, MD        Patient confirms/reports the following allergies:  No Known Allergies  Patient is appropriate to schedule for requested procedure(s): yes  AUTHORIZATION INFORMATION Primary Insurance:   ID #:   Group #:  Pre-Cert / Auth required:  Pre-Cert / Auth #:   Secondary Insurance:  ID #:              Group #: Pre-Cert / Auth required:  Pre-Cert / Auth #:   No orders of the defined types were placed in this encounter.    SCHEDULE INFORMATION: Procedure has been scheduled as follows:  Date: 08/05/2011              Time: 11:00 AM  Location: [facility] Short Stay  This Gastroenterology Pre-Precedure Form is being routed to the following provider(s) for review: Jonette Eva, MD

## 2011-07-24 NOTE — Telephone Encounter (Signed)
MOVI PREP SPLIT DOSING, REGULAR BREAKFAST. CLEAR LIQUIDS AFTER 9 AM.  

## 2011-07-24 NOTE — Telephone Encounter (Signed)
Rx and instructions mailed to pt.  

## 2011-07-25 ENCOUNTER — Encounter (HOSPITAL_COMMUNITY): Payer: Self-pay | Admitting: Pharmacy Technician

## 2011-07-27 ENCOUNTER — Encounter: Payer: Self-pay | Admitting: Family Medicine

## 2011-07-27 NOTE — Progress Notes (Signed)
  Subjective:    Patient ID: Angela Lambert, female    DOB: Apr 15, 1946, 66 y.o.   MRN: [id number]  HPI The PT is here for annual exam, and re-evaluation of chronic medical conditions, medication management and review of any available recent lab and radiology data.  Preventive health is updated, specifically  Cancer screening and Immunization.   Questions or concerns regarding consultations or procedures which the PT has had in the interim are  addressed. The PT denies any adverse reactions to current medications since the last visit.  There are no new concerns.  There are no specific complaints       Review of Systems See HPI Denies recent fever or chills. Denies sinus pressure, nasal congestion, ear pain or sore throat. Denies chest congestion, productive cough or wheezing. Denies chest pains, palpitations and leg swelling Denies abdominal pain, nausea, vomiting,diarrhea or constipation.   Denies dysuria, frequency, hesitancy or incontinence. Chronic back pain with reduced mobility Denies headaches, seizures, numbness, or tingling. Denies depression, anxiety or insomnia. Denies skin break down or rash.        Objective:   Physical Exam Pleasant well nourished female, alert and oriented x 3, in no cardio-pulmonary distress. Afebrile. HEENT No facial trauma or asymetry. Sinuses non tender.  EOMI, PERTL, fundoscopic exam is normal, no hemorhage or exudate.  External ears normal, tympanic membranes clear. Oropharynx moist, no exudate,   Neck: supple, no adenopathy,JVD or thyromegaly.No bruits.  Chest: Clear to ascultation bilaterally.No crackles or wheezes. Non tender to palpation  Breast: No asymetry,no masses. No nipple discharge or inversion. No axillary or supraclavicular adenopathy  Cardiovascular system; Heart sounds normal,  S1 and  S2 ,no S3.  No murmur, or thrill. Apical beat not displaced Peripheral pulses normal.  Abdomen: Soft, non tender, no  organomegaly or masses. No bruits. Bowel sounds normal. No guarding, tenderness or rebound.  Rectal:  No mass. Guaiac negative stool.  GU: External genitalia normal. No lesions. Vaginal canal normal.No discharge. Uterus atrophic no adnexal masses, no  adnexal tendernes no cervical motion tenderness.  Musculoskeletal exam: l Decraesed OM of adequate in, hips , shoulders and knees. No deformity ,swelling or crepitus noted. No muscle wasting or atrophy.   Neurologic: Cranial nerves 2 to 12 intact. Power reduced in left lower extremity. S/p CVA with disturbance in   gait. No tremor.  Skin: Intact, no ulceration, erythema , scaling or rash noted. Pigmentation normal throughout  Psych; Normal mood and affect. Judgement and concentration normal        Assessment & Plan:

## 2011-07-29 ENCOUNTER — Other Ambulatory Visit: Payer: Self-pay | Admitting: Family Medicine

## 2011-07-29 DIAGNOSIS — Z139 Encounter for screening, unspecified: Secondary | ICD-10-CM

## 2011-08-02 ENCOUNTER — Telehealth: Payer: Self-pay | Admitting: Gastroenterology

## 2011-08-02 MED ORDER — SODIUM CHLORIDE 0.45 % IV SOLN
Freq: Once | INTRAVENOUS | Status: AC
  Administered 2011-08-05: 10:00:00 via INTRAVENOUS

## 2011-08-02 NOTE — Telephone Encounter (Signed)
Tried to call pt about procedure time being moved up- but no answer and could not leave a message- Kim/Bonnie from Endo will try to contact pt later in the day

## 2011-08-05 ENCOUNTER — Other Ambulatory Visit: Payer: Self-pay | Admitting: Gastroenterology

## 2011-08-05 ENCOUNTER — Ambulatory Visit (HOSPITAL_COMMUNITY)
Admission: RE | Admit: 2011-08-05 | Discharge: 2011-08-05 | Disposition: A | Discharge status: Home / Self Care | Payer: Medicare Other | Source: Ambulatory Visit | Attending: Gastroenterology | Admitting: Gastroenterology

## 2011-08-05 ENCOUNTER — Encounter (HOSPITAL_COMMUNITY)
Admission: RE | Disposition: A | Discharge status: Home / Self Care | Payer: Self-pay | Source: Ambulatory Visit | Attending: Gastroenterology

## 2011-08-05 ENCOUNTER — Encounter (HOSPITAL_COMMUNITY): Payer: Self-pay | Admitting: *Deleted

## 2011-08-05 DIAGNOSIS — Z1211 Encounter for screening for malignant neoplasm of colon: Secondary | ICD-10-CM

## 2011-08-05 DIAGNOSIS — Z139 Encounter for screening, unspecified: Secondary | ICD-10-CM

## 2011-08-05 DIAGNOSIS — K648 Other hemorrhoids: Secondary | ICD-10-CM | POA: Insufficient documentation

## 2011-08-05 DIAGNOSIS — E785 Hyperlipidemia, unspecified: Secondary | ICD-10-CM | POA: Insufficient documentation

## 2011-08-05 DIAGNOSIS — D129 Benign neoplasm of anus and anal canal: Secondary | ICD-10-CM | POA: Insufficient documentation

## 2011-08-05 DIAGNOSIS — K620 Anal polyp: Secondary | ICD-10-CM

## 2011-08-05 DIAGNOSIS — Z7982 Long term (current) use of aspirin: Secondary | ICD-10-CM | POA: Insufficient documentation

## 2011-08-05 DIAGNOSIS — D128 Benign neoplasm of rectum: Secondary | ICD-10-CM | POA: Insufficient documentation

## 2011-08-05 DIAGNOSIS — K621 Rectal polyp: Secondary | ICD-10-CM

## 2011-08-05 DIAGNOSIS — I10 Essential (primary) hypertension: Secondary | ICD-10-CM | POA: Insufficient documentation

## 2011-08-05 DIAGNOSIS — Z79899 Other long term (current) drug therapy: Secondary | ICD-10-CM | POA: Insufficient documentation

## 2011-08-05 HISTORY — PX: COLONOSCOPY: SHX5424

## 2011-08-05 HISTORY — DX: Cerebral infarction, unspecified: I63.9

## 2011-08-05 SURGERY — COLONOSCOPY
Anesthesia: Moderate Sedation

## 2011-08-05 MED ORDER — MEPERIDINE HCL 100 MG/ML IJ SOLN
INTRAMUSCULAR | Status: AC
  Filled 2011-08-05: qty 1

## 2011-08-05 MED ORDER — MIDAZOLAM HCL 5 MG/5ML IJ SOLN
INTRAMUSCULAR | Status: DC | PRN
  Administered 2011-08-05: 1 mg via INTRAVENOUS
  Administered 2011-08-05: 2 mg via INTRAVENOUS

## 2011-08-05 MED ORDER — STERILE WATER FOR IRRIGATION IR SOLN
Status: DC | PRN
  Administered 2011-08-05: 11:00:00

## 2011-08-05 MED ORDER — MIDAZOLAM HCL 5 MG/5ML IJ SOLN
INTRAMUSCULAR | Status: AC
  Filled 2011-08-05: qty 5

## 2011-08-05 MED ORDER — MEPERIDINE HCL 100 MG/ML IJ SOLN
INTRAMUSCULAR | Status: DC | PRN
  Administered 2011-08-05: 50 mg via INTRAVENOUS

## 2011-08-05 NOTE — Op Note (Signed)
[facility] [address] [place], [place]  [address]  COLONOSCOPY PROCEDURE REPORT  PATIENT:  Angela Lambert, Angela Lambert  MR#:  [id number] BIRTHDATE:  08-26-1945, 65 yrs. old  GENDER:  female  ENDOSCOPIST:  Jonette Eva, MD REF. BY:  Syliva Overman, M.D. ASSISTANT:  PROCEDURE DATE:  08/05/2011 PROCEDURE:  Colonoscopy with biopsy  INDICATIONS:  screening  MEDICATIONS:   Demerol 50 mg IV, Versed 3 mg IV  DESCRIPTION OF PROCEDURE:    Physical exam was performed. Informed consent was obtained from the patient after explaining the benefits, risks, and alternatives to procedure.  The patient was connected to monitor and placed in left lateral position. Continuous oxygen was provided by nasal cannula and IV medicine administered through an indwelling cannula.  After administration of sedation and rectal exam, the patient's rectum was intubated and the EC-[id number] ([id number]) colonoscope was advanced under direct visualization to the cecum.  The scope was removed slowly by carefully examining the color, texture, anatomy, and integrity mucosa on the way out.  The patient was recovered in endoscopy and discharged home in satisfactory condition. <<PROCEDUREIMAGES>>  FINDINGS:  There were 5 Polyps(2-3 MM) identified and removed. in the rectum VIA COLD FORCEPS.  MODERATE Internal Hemorrhoids were found.  PREP QUALITY: GOOD CECAL W/D TIME:    17 minutes  COMPLICATIONS:    None  ENDOSCOPIC IMPRESSION: 1) Polyps, multiple in the rectum 2) Internal hemorrhoids  RECOMMENDATIONS: TCS IN 10 YEARS HIGH FIBER DIET AWAIT BIOPSIES  REPEAT EXAM:  No  ______________________________ Jonette Eva, MD  CC:  Syliva Overman, M.D.  n. eSIGNED:   Daneli Butkiewicz at 08/05/2011 11:17 AM  Lupita Raider, [phone]

## 2011-08-05 NOTE — OR Nursing (Signed)
Baseline heartrate prior to sedation 49-54 bpm.

## 2011-08-05 NOTE — H&P (Signed)
Primary Care Physician:  Syliva Overman, MD, MD Primary Gastroenterologist:  Dr. Darrick Penna  Pre-Procedure History & Physical: HPI:  Angela Lambert is a 66 y.o. female here for COLON CANCER SCREENING.   Past Medical History  Diagnosis Date  . ASCVD (arteriosclerotic cardiovascular disease)     stent placed 05/2002 following acute MI at age 15  . Hypertension   . Hyperlipidemia   . Depression     after rehab from stroke  . Allergic rhinitis   . Incontinence     Overactive bladder  . Ruptured cerebral aneurysm 2006    right resulted in coma for 6 weeks    Past Surgical History  Procedure Date  . Cerebral aneurysm repair 2006  . Orif hip fracture 2006    Prior to Admission medications   Medication Sig Start Date End Date Taking? Authorizing Provider  aspirin EC 81 MG tablet Take 81 mg by mouth daily.   Yes Historical Provider, MD  Calcium Carbonate-Vitamin D (CALCIUM + D) 600-200 MG-UNIT TABS Take 1 tablet by mouth 2 (two) times daily.   Yes Historical Provider, MD  Multiple Vitamin (MULITIVITAMIN WITH MINERALS) TABS Take 1 tablet by mouth daily.   Yes Historical Provider, MD  alendronate (FOSAMAX) 70 MG tablet Take 1 tablet (70 mg total) by mouth every 7 (seven) days. Take with a full glass of water on an empty stomach. 03/25/11   Syliva Overman, MD  FLUoxetine (PROZAC) 20 MG capsule Take 1 capsule (20 mg total) by mouth daily. 07/22/11   Syliva Overman, MD  Garlic 1000 MG CAPS Take 1,000 mg by mouth daily.     Historical Provider, MD  hydrochlorothiazide (HYDRODIURIL) 12.5 MG tablet Take 1 tablet (12.5 mg total) by mouth daily. 07/22/11   Syliva Overman, MD  lisinopril (PRINIVIL,ZESTRIL) 40 MG tablet Take 1 tablet (40 mg total) by mouth daily. 11/20/10   Syliva Overman, MD  metoprolol (LOPRESSOR) 50 MG tablet Take 1 tablet (50 mg total) by mouth daily. 07/22/11   Syliva Overman, MD  simvastatin (ZOCOR) 40 MG tablet Take 1 tablet (40 mg total) by mouth at bedtime. 07/22/11    Syliva Overman, MD    Allergies as of 07/24/2011  . (No Known Allergies)    Family History  Problem Relation Age of Onset  . Kidney failure Mother   . Heart disease Mother   . Kidney disease Mother   NO COLON CA OR POLYPS   History   Social History  . Marital Status: Divorced    Spouse Name: N/A    Number of Children: 2  . Years of Education: N/A   Occupational History  . disabled    Social History Main Topics  . Smoking status: Former Smoker -- 2.0 packs/day for 30 years    Types: Cigarettes    Quit date: 09/26/2004  . Smokeless tobacco: Never Used  . Alcohol Use: No  . Drug Use: No  . Sexually Active: Not on file   Other Topics Concern  . Not on file   Social History Narrative  . No narrative on file    Review of Systems: See HPI, otherwise negative ROS   Physical Exam: There were no vitals taken for this visit. General:   Alert,  pleasant and cooperative in NAD Head:  Normocephalic and atraumatic. Neck:  Supple;  Lungs:  Clear throughout to auscultation.    Heart:  Regular rate and rhythm. Abdomen:  Soft, nontender and nondistended. Normal bowel sounds, without guarding, and without rebound.  Neurologic:  Alert and  oriented x4;  grossly normal neurologically.  Impression/Plan:    AVERAGE RISK  PLAN: TCS TODAY\

## 2011-08-07 ENCOUNTER — Telehealth: Payer: Self-pay | Admitting: Gastroenterology

## 2011-08-07 NOTE — Telephone Encounter (Signed)
Please call pt. She had a polypoid lesionS removed and THEY ARE benign. TCS in 10 years. High fiber diet.

## 2011-08-07 NOTE — Telephone Encounter (Signed)
Pt informed

## 2011-08-07 NOTE — Telephone Encounter (Signed)
Results Cc to PCP and 10 yrs reminder is in the computer

## 2011-08-09 ENCOUNTER — Encounter (HOSPITAL_COMMUNITY): Payer: Self-pay | Admitting: Gastroenterology

## 2011-08-30 ENCOUNTER — Ambulatory Visit (HOSPITAL_COMMUNITY)
Admission: RE | Admit: 2011-08-30 | Discharge: 2011-08-30 | Disposition: A | Discharge status: Home / Self Care | Payer: Medicare Other | Source: Ambulatory Visit | Attending: Family Medicine | Admitting: Family Medicine

## 2011-08-30 DIAGNOSIS — Z139 Encounter for screening, unspecified: Secondary | ICD-10-CM

## 2011-08-30 DIAGNOSIS — Z1231 Encounter for screening mammogram for malignant neoplasm of breast: Secondary | ICD-10-CM | POA: Insufficient documentation

## 2011-10-07 ENCOUNTER — Other Ambulatory Visit: Payer: Self-pay | Admitting: Family Medicine

## 2011-11-06 ENCOUNTER — Other Ambulatory Visit: Payer: Self-pay | Admitting: Family Medicine

## 2011-12-06 ENCOUNTER — Encounter (HOSPITAL_COMMUNITY): Payer: Self-pay | Admitting: *Deleted

## 2011-12-06 ENCOUNTER — Emergency Department (HOSPITAL_COMMUNITY)
Admission: EM | Admit: 2011-12-06 | Discharge: 2011-12-06 | Disposition: A | Discharge status: Home / Self Care | Payer: Medicare Other | Attending: Emergency Medicine | Admitting: Emergency Medicine

## 2011-12-06 ENCOUNTER — Emergency Department (HOSPITAL_COMMUNITY): Payer: Medicare Other

## 2011-12-06 DIAGNOSIS — S82841A Displaced bimalleolar fracture of right lower leg, initial encounter for closed fracture: Secondary | ICD-10-CM

## 2011-12-06 DIAGNOSIS — I10 Essential (primary) hypertension: Secondary | ICD-10-CM | POA: Insufficient documentation

## 2011-12-06 DIAGNOSIS — Z79899 Other long term (current) drug therapy: Secondary | ICD-10-CM | POA: Insufficient documentation

## 2011-12-06 DIAGNOSIS — X500XXA Overexertion from strenuous movement or load, initial encounter: Secondary | ICD-10-CM | POA: Insufficient documentation

## 2011-12-06 DIAGNOSIS — Y9301 Activity, walking, marching and hiking: Secondary | ICD-10-CM | POA: Insufficient documentation

## 2011-12-06 DIAGNOSIS — Z8673 Personal history of transient ischemic attack (TIA), and cerebral infarction without residual deficits: Secondary | ICD-10-CM | POA: Insufficient documentation

## 2011-12-06 DIAGNOSIS — Z87891 Personal history of nicotine dependence: Secondary | ICD-10-CM | POA: Insufficient documentation

## 2011-12-06 DIAGNOSIS — I2510 Atherosclerotic heart disease of native coronary artery without angina pectoris: Secondary | ICD-10-CM | POA: Insufficient documentation

## 2011-12-06 DIAGNOSIS — E785 Hyperlipidemia, unspecified: Secondary | ICD-10-CM | POA: Insufficient documentation

## 2011-12-06 DIAGNOSIS — S82843A Displaced bimalleolar fracture of unspecified lower leg, initial encounter for closed fracture: Secondary | ICD-10-CM | POA: Insufficient documentation

## 2011-12-06 MED ORDER — OXYCODONE-ACETAMINOPHEN 5-325 MG PO TABS: ORAL_TABLET | ORAL | Status: DC

## 2011-12-06 NOTE — ED Provider Notes (Signed)
Medical screening examination/treatment/procedure(s) were performed by non-physician practitioner and as supervising physician I was immediately available for consultation/collaboration. Devoria Albe, MD, [facility]   Ward Givens, MD 12/06/11 [phone]

## 2011-12-06 NOTE — ED Notes (Signed)
Pt stepped in a hole and fell injuring her right ankle. Pt has swelling noted to the right outer ankle. Strong pedal pulses palpated. Pt states that it feels stiff but is not really hurting.

## 2011-12-06 NOTE — ED Notes (Signed)
Right leg elevated on pillow. Able to wiggle toes. Right foot pink and warm to touch. Strong pulse palpated.

## 2011-12-06 NOTE — Discharge Instructions (Signed)
Ankle Fracture A fracture is a break in the bone. A cast or splint is used to protect and keep your injured bone from moving.  HOME CARE INSTRUCTIONS   Use your crutches as directed.   To lessen the swelling, keep the injured leg elevated while sitting or lying down.   Apply ice to the injury for 15 to 20 minutes, 3 to 4 times per day while awake for 2 days. Put the ice in a plastic bag and place a thin towel between the bag of ice and your cast.   If you have a plaster or fiberglass cast:   Do not try to scratch the skin under the cast using sharp or pointed objects.   Check the skin around the cast every day. You may put lotion on any red or sore areas.   Keep your cast dry and clean.   If you have a plaster splint:   Wear the splint as directed.   You may loosen the elastic around the splint if your toes become numb, tingle, or turn cold or blue.   Do not put pressure on any part of your cast or splint; it may break. Rest your cast only on a pillow the first 24 hours until it is fully hardened.   Your cast or splint can be protected during bathing with a plastic bag. Do not lower the cast or splint into water.   Take medications as directed by your caregiver. Only take over-the-counter or prescription medicines for pain, discomfort, or fever as directed by your caregiver.   Do not drive a vehicle until your caregiver specifically tells you it is safe to do so.   If your caregiver has given you a follow-up appointment, it is very important to keep that appointment. Not keeping the appointment could result in a chronic or permanent injury, pain, and disability. If there is any problem keeping the appointment, you must call back to this facility for assistance.  SEEK IMMEDIATE MEDICAL CARE IF:   Your cast gets damaged or breaks.   You have continued severe pain or more swelling than you did before the cast was put on.   Your skin or toenails below the injury turn blue or gray,  or feel cold or numb.   There is a bad smell or new stains and/or purulent (pus like) drainage coming from under the cast.  If you do not have a window in your cast for observing the wound, a discharge or minor bleeding may show up as a stain on the outside of your cast. Report these findings to your caregiver. MAKE SURE YOU:   Understand these instructions.   Will watch your condition.   Will get help right away if you are not doing well or get worse.  Document Released: 06/14/2000 Document Revised: 06/06/2011 Document Reviewed: 01/19/2008 [facility] Patient Information 2012 [place], [place].   Do not bear any weight on the right foot.  Apply ice several times daily and elevate as much as possible.  Take the pain medicine as directed.  Use the walker when walking.  Dr. Hilda Lias wants to see you in his office on Monday, 12-09-11.  Call for a specific appt time.

## 2011-12-06 NOTE — ED Provider Notes (Signed)
History     CSN: [id number]  Arrival date & time 12/06/11  1218   First MD Initiated Contact with Patient 12/06/11 1241      Chief Complaint  Patient presents with  . Fall    (Consider location/radiation/quality/duration/timing/severity/associated sxs/prior treatment) HPI Comments: Pt stepped on uneven ground and inverted R ankle.  No other injuries.  Patient is a 66 y.o. female presenting with fall. The history is provided by the patient. No language interpreter was used.  Fall The accident occurred 3 to 5 hours ago. The fall occurred while walking. She landed on grass. The pain is moderate. She was ambulatory at the scene. There was no entrapment after the fall. There was no drug use involved in the accident. There was no alcohol use involved in the accident. Pertinent negatives include no numbness.    Past Medical History  Diagnosis Date  . ASCVD (arteriosclerotic cardiovascular disease)     stent placed 05/2002 following acute MI at age 20  . Hypertension   . Hyperlipidemia   . Depression     after rehab from stroke  . Allergic rhinitis   . Incontinence     Overactive bladder  . 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 Stroke     Past Surgical History  Procedure Date  . Cerebral aneurysm repair 2006  . Orif hip fracture 2006  . Colonoscopy 08/05/2011    Procedure: COLONOSCOPY;  Surgeon: Arlyce Harman, MD;  Location: [facility];  Service: Endoscopy;  Laterality: N/A;  11:00 AM    Family History  Problem Relation Age of Onset  . Kidney failure Mother   . Heart disease Mother   . Kidney disease Mother   . Colon cancer Neg Hx     History  Substance Use Topics  . Smoking status: Former Smoker -- 2.0 packs/day for 30 years    Types: Cigarettes    Quit date: 09/26/2004  . Smokeless tobacco: Never Used  . Alcohol Use: No    OB History    Grav Para Term Preterm Abortions TAB SAB Ect Mult Living                  Review of  Systems  Musculoskeletal:       Ankle injury  Neurological: Negative for numbness.  All other systems reviewed and are negative.    Allergies  Review of patient's allergies indicates no known allergies.  Home Medications   Current Outpatient Rx  Name Route Sig Dispense Refill  . ALENDRONATE SODIUM 70 MG PO TABS  TAKE ONE TABLET BY MOUTH EVERY SEVEN DAYS**TAKE WITH A FULL GLASS OF WATER ON AN EMPTY STOMACH** 12 tablet 1  . ASPIRIN EC 81 MG PO TBEC Oral Take 81 mg by mouth daily.    Marland Kitchen CALCIUM CARBONATE-VITAMIN D 600-200 MG-UNIT PO TABS Oral Take 1 tablet by mouth 2 (two) times daily.    Marland Kitchen FLUOXETINE HCL 20 MG PO CAPS Oral Take 1 capsule (20 mg total) by mouth daily. 90 capsule 1  . GARLIC 1000 MG PO CAPS Oral Take 1,000 mg by mouth daily.     Marland Kitchen HYDROCHLOROTHIAZIDE 12.5 MG PO TABS Oral Take 1 tablet (12.5 mg total) by mouth daily. 90 tablet 1  . LISINOPRIL 40 MG PO TABS  TAKE ONE TABLET BY MOUTH EVERY DAY 90 tablet 0  . METOPROLOL TARTRATE 50 MG PO TABS Oral Take 1 tablet (50 mg total) by  mouth daily. 90 tablet 1  . ADULT MULTIVITAMIN W/MINERALS CH Oral Take 1 tablet by mouth daily.    . OXYCODONE-ACETAMINOPHEN 5-325 MG PO TABS  One tab po q 4-6 hrs prn pain. 20 tablet 0  . SIMVASTATIN 40 MG PO TABS Oral Take 1 tablet (40 mg total) by mouth at bedtime. 90 tablet 1    BP 133/58  Pulse 58  Temp(Src) 98 F (36.7 C) (Oral)  Resp 18  SpO2 97%  Physical Exam  Nursing note and vitals reviewed. Constitutional: She is oriented to person, place, and time. She appears well-developed and well-nourished. No distress.  HENT:  Head: Normocephalic and atraumatic.  Eyes: EOM are normal.  Neck: Normal range of motion.  Cardiovascular: Normal rate, regular rhythm and normal heart sounds.   Pulmonary/Chest: Effort normal and breath sounds normal.  Abdominal: Soft. She exhibits no distension. There is no tenderness.  Musculoskeletal: She exhibits tenderness.       Right ankle: She exhibits  decreased range of motion and swelling. She exhibits no ecchymosis, no deformity, no laceration and normal pulse. tenderness. Lateral malleolus and medial malleolus tenderness found.       Feet:  Neurological: She is alert and oriented to person, place, and time.  Skin: Skin is warm and dry.  Psychiatric: She has a normal mood and affect. Judgment normal.    ED Course  Procedures (including critical care time)  Labs Reviewed - No data to display Dg Ankle Complete Right  12/06/2011  *RADIOLOGY REPORT*  Clinical Data: Fall  RIGHT ANKLE - COMPLETE 3+ VIEW  Comparison: None.  Findings: Fractures through the medial malleolus is at the level of the tibial plafond.  The fracture is mildly displaced.  This also fracture of the distal fibula extending into the ankle joint with displacement.  The fracture line extends from the tibial plafond superiorly.  There is associated soft tissue swelling about the ankle joint.  Minimally displaced posterior malleolar fracture. Spurring at the inferior calcaneus.  IMPRESSION: Trimalleolar ankle fracture as described.  Weber B.  Original Report Authenticated By: Donavan Burnet, M.D.   Discussed XR findings with dr. Hilda Lias who then pulled films up on PAC.  Wants posterior splint.  Non-weight bearing. Ice, elevation pain med and f/u in his office in 3 days.  1. Closed bimalleolar fracture of right ankle       MDM          Worthy Rancher, PA 12/06/11 1447

## 2011-12-06 NOTE — ED Notes (Addendum)
Pt was walking in the yard this am when she stepped into a hole causing her to fall, pt /o pain to right ankle area. Denies any other injury. Ice pack applied to right ankle,

## 2011-12-09 ENCOUNTER — Encounter: Payer: Self-pay | Admitting: Orthopedic Surgery

## 2011-12-09 ENCOUNTER — Ambulatory Visit (INDEPENDENT_AMBULATORY_CARE_PROVIDER_SITE_OTHER): Payer: Medicare Other | Admitting: Orthopedic Surgery

## 2011-12-09 VITALS — BP 120/70 | Ht 62.0 in | Wt 140.0 lb

## 2011-12-09 DIAGNOSIS — S82853A Displaced trimalleolar fracture of unspecified lower leg, initial encounter for closed fracture: Secondary | ICD-10-CM | POA: Insufficient documentation

## 2011-12-09 NOTE — Progress Notes (Signed)
Patient ID: Angela Lambert, female   DOB: 05/14/1946, 65 y.o.   MRN: [id number] Chief complaint RIGHT ankle pain.  Date of injury June 7.  The patient fell at her home injured her RIGHT ankle and sustained a trimalleolar fracture. Initial treatment was initiated in the emergency room.  Posterior splint was applied. Currently having 1/10 pain with tenderness swelling in the area of injury.  I have discussed the fact that this trimalleolar fracture will not hold and a cast. Furthermore, she is fairly weak on the LEFT side for stroke, and need stable fixation of the RIGHT ankle for ambulation.  She will stay with her daughter after surgery versus going to a skilled care center. The following portions of the patient's history were reviewed and updated as appropriate: allergies, current medications, past family history, past medical history, past social history, past surgical history and problem list.    Cardiologist Dr. Rockport. Primary care physician is Dr. Simpson.  The patient has the following review of systems Anxiety All other systems reviewed were negative Review of Systems Pertinent items are noted in HPI.   Objective:    BP 122/72  Ht 4' 11" (1.499 m)  Wt 61.236 kg (135 lb)  BMI 27.27 kg/m2        Vital signs are stable as recorded  General appearance is normal  The patient is alert and oriented x3  The patient's mood and affect are normal  Gait assessment: can not walk  The cardiovascular exam reveals normal pulses and temperature without edema swelling.  The lymphatic system is negative for palpable lymph nodes  The sensory exam is normal.  There are no pathologic reflexes.  Balance is normal.  Upper extremity exam  Inspection and palpation revealed no abnormalities in the upper extremities.  Range of motion is full without contracture.  Motor exam is normal with grade 5 strength.  The joints are fully reduced without subluxation.  There is no  atrophy or tremor and muscle tone is normal.  All joints are stable.  Left Lower extremity exam  Ambulation is normal.  Inspection and palpation revealed no tenderness or abnormality in alignment in the lower extremities. Range of motion is full.  Strength is grade 5.   all joints are stable.   Exam of the right ankle  Inspection swelling bruising tenderness right ankle severe no blisters  Range of motion 0-15 DF-PF Stability unstable  Strength normal tone  Skin as stated   X-ray trimalleolar fracture right ankle   Assessment:    right trimall. Fracture    Plan:    otif/orif right ankle    

## 2011-12-09 NOTE — Patient Instructions (Signed)
You have been scheduled for surgery.  All surgeries carry some risk.  Remember you always have the option of continued nonsurgical treatment. However in this situation the risks vs. the benefits favor surgery as the best treatment option. The risks of the surgery includes the following but is not limited to bleeding, infection, pulmonary embolus, death from anesthesia, nerve injury vascular injury or need for further surgery, continued pain.  Specific to this procedure the following risks and complications are rare but possible Stiffness Pain  Infection which may require several subsequent surgeries The plate and screws may need to be removed in 2 years

## 2011-12-10 ENCOUNTER — Telehealth: Payer: Self-pay | Admitting: Orthopedic Surgery

## 2011-12-10 ENCOUNTER — Encounter (HOSPITAL_COMMUNITY)
Admission: RE | Admit: 2011-12-10 | Discharge: 2011-12-10 | Disposition: A | Discharge status: Home / Self Care | Payer: Medicare Other | Source: Ambulatory Visit | Attending: Orthopedic Surgery | Admitting: Orthopedic Surgery

## 2011-12-10 ENCOUNTER — Encounter (HOSPITAL_COMMUNITY): Payer: Self-pay

## 2011-12-10 LAB — BASIC METABOLIC PANEL
Calcium: 11.1 mg/dL — ABNORMAL HIGH (ref 8.4–10.5)
Chloride: 98 mEq/L (ref 96–112)
Creatinine, Ser: 1.01 mg/dL (ref 0.50–1.10)
GFR calc Af Amer: 66 mL/min — ABNORMAL LOW (ref >90)
Sodium: 140 mEq/L (ref 135–145)

## 2011-12-10 LAB — HEMOGLOBIN AND HEMATOCRIT, BLOOD
HCT: 35.3 % — ABNORMAL LOW (ref 36.0–46.0)
Hemoglobin: 12.8 g/dL (ref 12.0–15.0)

## 2011-12-10 LAB — SURGICAL PCR SCREEN
MRSA, PCR: NEGATIVE
Staphylococcus aureus: NEGATIVE

## 2011-12-10 NOTE — Telephone Encounter (Signed)
Contacted [employer] at ph# [phone], regarding out-patient surgery scheduled at [facility] 2011/12/27, CPT codes 84696, [phone], ICD9 code 824.6.  Per Larita Fife, no pre-authorization is required for these procedures, unless they would involve in-patient stay. No reference # given, states to use her name and today's date for reference.

## 2011-12-10 NOTE — Patient Instructions (Addendum)
[address] MAH[address] Angela Lambert  12/10/2011   Your procedure is scheduled on:  12/12/2011 Report to [facility] at  1130   AM.  Call this number if you have problems the morning of surgery: [phone]   Remember:   Do not eat food:After Midnight.  May have clear liquids:until Midnight .  Clear liquids include soda, tea, black coffee, apple or grape juice, broth.  Take these medicines the morning of surgery with A SIP OF WATER: prozac,hydrodiuril,lisinopril,metoprolol,percocet   Do not wear jewelry, make-up or nail polish.  Do not wear lotions, powders, or perfumes. You may wear deodorant.  Do not shave 48 hours prior to surgery. Men may shave face and neck.  Do not bring valuables to the hospital.  Contacts, dentures or bridgework may not be worn into surgery.  Leave suitcase in the car. After surgery it may be brought to your room.  For patients admitted to the hospital, checkout time is 11:00 AM the day of discharge.   Patients discharged the day of surgery will not be allowed to drive home.  Name and phone number of your driver: family  Special Instructions: CHG Shower Use Special Wash: 1/2 bottle night before surgery and 1/2 bottle morning of surgery.   Please read over the following fact sheets that you were given: Pain Booklet, MRSA Information, Surgical Site Infection Prevention, Anesthesia Post-op Instructions and Care and Recovery After Surgery Ankle Fracture A fracture is a break in the bone. A cast or splint is used to protect and keep your injured bone from moving.  HOME CARE INSTRUCTIONS   Use your crutches as directed.   To lessen the swelling, keep the injured leg elevated while sitting or lying down.   Apply ice to the injury for 15 to 20 minutes, 3 to 4 times per day while awake for 2 days. Put the ice in a plastic bag and place a thin towel between the bag of ice and your cast.   If you have a plaster or fiberglass cast:   Do not try to scratch the skin under the cast  using sharp or pointed objects.   Check the skin around the cast every day. You may put lotion on any red or sore areas.   Keep your cast dry and clean.   If you have a plaster splint:   Wear the splint as directed.   You may loosen the elastic around the splint if your toes become numb, tingle, or turn cold or blue.   Do not put pressure on any part of your cast or splint; it may break. Rest your cast only on a pillow the first 24 hours until it is fully hardened.   Your cast or splint can be protected during bathing with a plastic bag. Do not lower the cast or splint into water.   Take medications as directed by your caregiver. Only take over-the-counter or prescription medicines for pain, discomfort, or fever as directed by your caregiver.   Do not drive a vehicle until your caregiver specifically tells you it is safe to do so.   If your caregiver has given you a follow-up appointment, it is very important to keep that appointment. Not keeping the appointment could result in a chronic or permanent injury, pain, and disability. If there is any problem keeping the appointment, you must call back to this facility for assistance.  SEEK IMMEDIATE MEDICAL CARE IF:   Your cast gets damaged or breaks.  You have continued severe pain or more swelling than you did before the cast was put on.   Your skin or toenails below the injury turn blue or gray, or feel cold or numb.   There is a bad smell or new stains and/or purulent (pus like) drainage coming from under the cast.  If you do not have a window in your cast for observing the wound, a discharge or minor bleeding may show up as a stain on the outside of your cast. Report these findings to your caregiver. MAKE SURE YOU:   Understand these instructions.   Will watch your condition.   Will get help right away if you are not doing well or get worse.  Document Released: 06/14/2000 Document Revised: 06/06/2011 Document Reviewed:  01/19/2008 [facility] Patient Information 2012 [place], [place].PATIENT INSTRUCTIONS POST-ANESTHESIA  IMMEDIATELY FOLLOWING SURGERY:  Do not drive or operate machinery for the first twenty four hours after surgery.  Do not make any important decisions for twenty four hours after surgery or while taking narcotic pain medications or sedatives.  If you develop intractable nausea and vomiting or a severe headache please notify your doctor immediately.  FOLLOW-UP:  Please make an appointment with your surgeon as instructed. You do not need to follow up with anesthesia unless specifically instructed to do so.  WOUND CARE INSTRUCTIONS (if applicable):  Keep a dry clean dressing on the anesthesia/puncture wound site if there is drainage.  Once the wound has quit draining you may leave it open to air.  Generally you should leave the bandage intact for twenty four hours unless there is drainage.  If the epidural site drains for more than 36-48 hours please call the anesthesia department.  QUESTIONS?:  Please feel free to call your physician or the hospital operator if you have any questions, and they will be happy to assist you.

## 2011-12-12 ENCOUNTER — Ambulatory Visit (HOSPITAL_COMMUNITY): Payer: Medicare Other

## 2011-12-12 ENCOUNTER — Ambulatory Visit (HOSPITAL_COMMUNITY)
Admission: RE | Admit: 2011-12-12 | Discharge: 2011-12-12 | Disposition: A | Discharge status: Home / Self Care | Payer: Medicare Other | Source: Ambulatory Visit | Attending: Orthopedic Surgery | Admitting: Orthopedic Surgery

## 2011-12-12 ENCOUNTER — Encounter (HOSPITAL_COMMUNITY): Payer: Self-pay | Admitting: *Deleted

## 2011-12-12 ENCOUNTER — Encounter (HOSPITAL_COMMUNITY): Payer: Self-pay | Admitting: Anesthesiology

## 2011-12-12 ENCOUNTER — Encounter (HOSPITAL_COMMUNITY)
Admission: RE | Disposition: A | Discharge status: Home / Self Care | Payer: Self-pay | Source: Ambulatory Visit | Attending: Orthopedic Surgery

## 2011-12-12 ENCOUNTER — Ambulatory Visit (HOSPITAL_COMMUNITY): Payer: Medicare Other | Admitting: Anesthesiology

## 2011-12-12 DIAGNOSIS — X500XXA Overexertion from strenuous movement or load, initial encounter: Secondary | ICD-10-CM | POA: Insufficient documentation

## 2011-12-12 DIAGNOSIS — Y9301 Activity, walking, marching and hiking: Secondary | ICD-10-CM | POA: Insufficient documentation

## 2011-12-12 DIAGNOSIS — S82853A Displaced trimalleolar fracture of unspecified lower leg, initial encounter for closed fracture: Secondary | ICD-10-CM

## 2011-12-12 DIAGNOSIS — Z01812 Encounter for preprocedural laboratory examination: Secondary | ICD-10-CM | POA: Insufficient documentation

## 2011-12-12 HISTORY — PX: ORIF ANKLE FRACTURE: SHX5408

## 2011-12-12 SURGERY — OPEN REDUCTION INTERNAL FIXATION (ORIF) ANKLE FRACTURE
Anesthesia: Spinal | Site: Ankle | Laterality: Right | Wound class: Clean

## 2011-12-12 MED ORDER — PROPOFOL 10 MG/ML IV EMUL
INTRAVENOUS | Status: AC
  Filled 2011-12-12: qty 20

## 2011-12-12 MED ORDER — PROPOFOL 10 MG/ML IV EMUL
INTRAVENOUS | Status: DC | PRN
  Administered 2011-12-12: 100 ug/kg/min via INTRAVENOUS

## 2011-12-12 MED ORDER — FENTANYL CITRATE 0.05 MG/ML IJ SOLN
INTRAMUSCULAR | Status: AC
  Filled 2011-12-12: qty 2

## 2011-12-12 MED ORDER — ONDANSETRON HCL 4 MG/2ML IJ SOLN: 4.0000 mg | Freq: Once | INTRAMUSCULAR | Status: DC | PRN

## 2011-12-12 MED ORDER — MIDAZOLAM HCL 2 MG/2ML IJ SOLN
1.0000 mg | INTRAMUSCULAR | Status: DC | PRN
  Administered 2011-12-12: 2 mg via INTRAVENOUS

## 2011-12-12 MED ORDER — ONDANSETRON HCL 4 MG/2ML IJ SOLN
INTRAMUSCULAR | Status: AC
  Administered 2011-12-12: 4 mg via INTRAVENOUS
  Filled 2011-12-12: qty 2

## 2011-12-12 MED ORDER — EPHEDRINE SULFATE 50 MG/ML IJ SOLN
INTRAMUSCULAR | Status: AC
  Filled 2011-12-12: qty 1

## 2011-12-12 MED ORDER — CHLORHEXIDINE GLUCONATE 4 % EX LIQD
60.0000 mL | Freq: Once | CUTANEOUS | Status: DC
  Filled 2011-12-12: qty 60

## 2011-12-12 MED ORDER — LIDOCAINE IN DEXTROSE 5-7.5 % IV SOLN
INTRAVENOUS | Status: AC
  Filled 2011-12-12: qty 2

## 2011-12-12 MED ORDER — ONDANSETRON HCL 4 MG/2ML IJ SOLN
4.0000 mg | Freq: Once | INTRAMUSCULAR | Status: AC
  Administered 2011-12-12: 4 mg via INTRAVENOUS

## 2011-12-12 MED ORDER — SODIUM CHLORIDE 0.9 % IR SOLN
Status: DC | PRN
  Administered 2011-12-12: 2000 mL

## 2011-12-12 MED ORDER — HYDROCODONE-ACETAMINOPHEN 7.5-325 MG PO TABS
1.0000 | ORAL_TABLET | Freq: Once | ORAL | Status: AC
  Administered 2011-12-12: 1 via ORAL

## 2011-12-12 MED ORDER — EPHEDRINE SULFATE 50 MG/ML IJ SOLN
INTRAMUSCULAR | Status: DC | PRN
  Administered 2011-12-12 (×2): 5 mg via INTRAVENOUS

## 2011-12-12 MED ORDER — PROMETHAZINE HCL 12.5 MG PO TABS: 12.5000 mg | ORAL_TABLET | Freq: Four times a day (QID) | ORAL | Status: DC | PRN

## 2011-12-12 MED ORDER — MIDAZOLAM HCL 2 MG/2ML IJ SOLN
INTRAMUSCULAR | Status: AC
  Administered 2011-12-12: 2 mg via INTRAVENOUS
  Filled 2011-12-12: qty 2

## 2011-12-12 MED ORDER — CEFAZOLIN SODIUM 1-5 GM-% IV SOLN
INTRAVENOUS | Status: AC
  Filled 2011-12-12: qty 50

## 2011-12-12 MED ORDER — HYDROCODONE-ACETAMINOPHEN 10-325 MG PO TABS: 1.0000 | ORAL_TABLET | ORAL | Status: DC | PRN

## 2011-12-12 MED ORDER — CEFAZOLIN SODIUM 1-5 GM-% IV SOLN
1.0000 g | INTRAVENOUS | Status: AC
  Administered 2011-12-12: 1 g via INTRAVENOUS

## 2011-12-12 MED ORDER — LACTATED RINGERS IV SOLN
INTRAVENOUS | Status: DC
  Administered 2011-12-12: 500 mL via INTRAVENOUS
  Administered 2011-12-12: 1000 mL via INTRAVENOUS

## 2011-12-12 MED ORDER — HYDROCODONE-ACETAMINOPHEN 7.5-325 MG PO TABS
ORAL_TABLET | ORAL | Status: AC
  Administered 2011-12-12: 1 via ORAL
  Filled 2011-12-12: qty 1

## 2011-12-12 MED ORDER — CELECOXIB 100 MG PO CAPS
ORAL_CAPSULE | ORAL | Status: AC
  Administered 2011-12-12: 400 mg via ORAL
  Filled 2011-12-12: qty 4

## 2011-12-12 MED ORDER — FENTANYL CITRATE 0.05 MG/ML IJ SOLN
INTRAMUSCULAR | Status: DC | PRN
  Administered 2011-12-12: 12.5 ug via INTRATHECAL

## 2011-12-12 MED ORDER — CELECOXIB 100 MG PO CAPS
400.0000 mg | ORAL_CAPSULE | Freq: Once | ORAL | Status: AC
  Administered 2011-12-12: 400 mg via ORAL

## 2011-12-12 MED ORDER — BUPIVACAINE-EPINEPHRINE PF 0.5-1:200000 % IJ SOLN
INTRAMUSCULAR | Status: AC
  Filled 2011-12-12: qty 20

## 2011-12-12 MED ORDER — LIDOCAINE IN DEXTROSE 5-7.5 % IV SOLN
INTRAVENOUS | Status: DC | PRN
  Administered 2011-12-12: 100 mg via INTRATHECAL

## 2011-12-12 MED ORDER — FENTANYL CITRATE 0.05 MG/ML IJ SOLN
INTRAMUSCULAR | Status: DC | PRN
  Administered 2011-12-12: 12.5 ug via INTRAVENOUS

## 2011-12-12 MED ORDER — BUPIVACAINE-EPINEPHRINE PF 0.5-1:200000 % IJ SOLN
INTRAMUSCULAR | Status: DC | PRN
  Administered 2011-12-12: 60 mL

## 2011-12-12 MED ORDER — FENTANYL CITRATE 0.05 MG/ML IJ SOLN: 25.0000 ug | INTRAMUSCULAR | Status: DC | PRN

## 2011-12-12 SURGICAL SUPPLY — 67 items
1.25mm k-wire ×4 IMPLANT
BAG HAMPER (MISCELLANEOUS) ×2 IMPLANT
BANDAGE ELASTIC 3 VELCRO NS (GAUZE/BANDAGES/DRESSINGS) ×2 IMPLANT
BANDAGE ELASTIC 4 VELCRO NS (GAUZE/BANDAGES/DRESSINGS) ×4 IMPLANT
BANDAGE ELASTIC 6 VELCRO NS (GAUZE/BANDAGES/DRESSINGS) ×4 IMPLANT
BANDAGE ESMARK 4X12 BL STRL LF (DISPOSABLE) ×1 IMPLANT
BIT DRILL 2.5X110 QC LCP DISP (BIT) ×2 IMPLANT
BIT DRILL 2.8 (BIT) ×1
BIT DRILL CANN QC 2.8X165 (BIT) ×1 IMPLANT
BIT DRILL QC 3.5X110 (BIT) IMPLANT
BLADE SURG SZ10 CARB STEEL (BLADE) ×2 IMPLANT
BNDG COHESIVE 4X5 TAN STRL (GAUZE/BANDAGES/DRESSINGS) ×2 IMPLANT
BNDG ESMARK 4X12 BLUE STRL LF (DISPOSABLE) ×2
CHLORAPREP W/TINT 26ML (MISCELLANEOUS) ×2 IMPLANT
CLOTH BEACON ORANGE TIMEOUT ST (SAFETY) ×2 IMPLANT
COVER LIGHT HANDLE STERIS (MISCELLANEOUS) ×4 IMPLANT
CUFF TOURNIQUET SINGLE 34IN LL (TOURNIQUET CUFF) ×2 IMPLANT
DRAPE C-ARM FOLDED MOBILE STRL (DRAPES) ×2 IMPLANT
DRAPE PROXIMA HALF (DRAPES) ×2 IMPLANT
DRILL BIT 2.8MM (BIT) ×1
GAUZE XEROFORM 5X9 LF (GAUZE/BANDAGES/DRESSINGS) ×2 IMPLANT
GLOVE ECLIPSE 6.5 STRL STRAW (GLOVE) ×4 IMPLANT
GLOVE INDICATOR 7.0 STRL GRN (GLOVE) ×6 IMPLANT
GLOVE SKINSENSE NS SZ8.0 LF (GLOVE) ×1
GLOVE SKINSENSE STRL SZ8.0 LF (GLOVE) ×1 IMPLANT
GLOVE SS N UNI LF 8.5 STRL (GLOVE) ×2 IMPLANT
GOWN STRL REIN XL XLG (GOWN DISPOSABLE) ×6 IMPLANT
INST SET MINOR BONE (KITS) ×2 IMPLANT
K-WIRE 1.25 TRCR POINT 150 (WIRE) ×4
K-WIRE 1.6X150 (WIRE)
K-WIRE 2.0X150M (WIRE)
KIT ROOM TURNOVER APOR (KITS) ×2 IMPLANT
KWIRE 1.25 TRCR POINT 150 (WIRE) ×2 IMPLANT
KWIRE 1.6X150 (WIRE) IMPLANT
KWIRE 2.0X150M (WIRE) IMPLANT
MANIFOLD NEPTUNE II (INSTRUMENTS) ×2 IMPLANT
NEEDLE HYPO 21X1.5 SAFETY (NEEDLE) ×2 IMPLANT
NS IRRIG 1000ML POUR BTL (IV SOLUTION) ×4 IMPLANT
PACK BASIC LIMB (CUSTOM PROCEDURE TRAY) ×2 IMPLANT
PAD ABD 5X9 TENDERSORB (GAUZE/BANDAGES/DRESSINGS) ×4 IMPLANT
PAD ARMBOARD 7.5X6 YLW CONV (MISCELLANEOUS) ×2 IMPLANT
PAD CAST 4YDX4 CTTN HI CHSV (CAST SUPPLIES) ×1 IMPLANT
PADDING CAST COTTON 4X4 STRL (CAST SUPPLIES) ×1
PADDING CAST COTTON 6X4 STRL (CAST SUPPLIES) ×2 IMPLANT
PIN CAPS ORTHO GREEN .062 (PIN) ×2 IMPLANT
PLATE LCP 3.5 1/3 TUB 7HX81 (Plate) ×2 IMPLANT
SCREW CANC FT 4.0X20 (Screw) ×4 IMPLANT
SCREW CANC FT/18 4.0 (Screw) ×2 IMPLANT
SCREW CORTEX 3.5 12MM (Screw) ×2 IMPLANT
SCREW CORTEX 3.5 14MM (Screw) ×1 IMPLANT
SCREW CORTEX 3.5 28MM (Screw) ×1 IMPLANT
SCREW LOCK CORT ST 3.5X12 (Screw) ×2 IMPLANT
SCREW LOCK CORT ST 3.5X14 (Screw) ×1 IMPLANT
SCREW LOCK CORT ST 3.5X28 (Screw) ×1 IMPLANT
SET BASIN LINEN APH (SET/KITS/TRAYS/PACK) ×2 IMPLANT
SPLINT IMMOBILIZER J 3INX20FT (CAST SUPPLIES)
SPLINT J IMMOBILIZER 3X20FT (CAST SUPPLIES) IMPLANT
SPLINT J IMMOBILIZER 4X20FT (CAST SUPPLIES) ×1 IMPLANT
SPLINT J PLASTER J 4INX20Y (CAST SUPPLIES) ×1
SPONGE GAUZE 4X4 12PLY (GAUZE/BANDAGES/DRESSINGS) ×2 IMPLANT
SPONGE LAP 18X18 X RAY DECT (DISPOSABLE) ×2 IMPLANT
STAPLER VISISTAT 35W (STAPLE) ×2 IMPLANT
SUT ETHILON 3 0 FSL (SUTURE) ×2 IMPLANT
SUT MON AB 0 CT1 (SUTURE) ×2 IMPLANT
SUT MON AB 2-0 CT1 36 (SUTURE) IMPLANT
SYR 30ML LL (SYRINGE) ×2 IMPLANT
SYR BULB IRRIGATION 50ML (SYRINGE) ×2 IMPLANT

## 2011-12-12 NOTE — Anesthesia Postprocedure Evaluation (Signed)
Anesthesia Post Note  Patient: Angela Lambert  Procedure(s) Performed: Procedure(s) (LRB): OPEN REDUCTION INTERNAL FIXATION (ORIF) ANKLE FRACTURE (Right)  Anesthesia type: Spinal  Patient location: PACU  Post pain: Pain level controlled  Post assessment: Post-op Vital signs reviewed, Patient's Cardiovascular Status Stable, Respiratory Function Stable, Patent Airway, No signs of Nausea or vomiting and Pain level controlled  Last Vitals:  Filed Vitals:   12/12/11 1459  BP: 96/72  Pulse: 68  Temp: 36.6 C  Resp: 16    Post vital signs: Reviewed and stable  Level of consciousness: awake and alert   Complications: No apparent anesthesia complications

## 2011-12-12 NOTE — Progress Notes (Signed)
Time not correct

## 2011-12-12 NOTE — Progress Notes (Signed)
Actual time in pacu 1458

## 2011-12-12 NOTE — Interval H&P Note (Signed)
History and Physical Interval Note:  12/12/2011 1:05 PM  Angela Lambert  has presented today for surgery, with the diagnosis of fracture right ankle  The various methods of treatment have been discussed with the patient and family. After consideration of risks, benefits and other options for treatment, the patient has consented to  Procedure(s) (LRB):RIGHT  OPEN REDUCTION INTERNAL FIXATION (ORIF) ANKLE FRACTURE (Right) as a surgical intervention .  The patients' history has been reviewed, patient examined, no change in status, stable for surgery.  I have reviewed the patients' chart and labs.  Questions were answered to the patient's satisfaction.     Fuller Canada

## 2011-12-12 NOTE — Anesthesia Preprocedure Evaluation (Signed)
Anesthesia Evaluation  Patient identified by MRN, date of birth, ID band Patient awake    Reviewed: Allergy & Precautions, H&P , NPO status , Patient's Chart, lab work & pertinent test results  Airway Mallampati: II  Neck ROM: Full    Dental  (+) Missing and Teeth Intact   Pulmonary neg pulmonary ROS, former smoker breath sounds clear to auscultation        Cardiovascular hypertension, Pt. on medications + CAD, + Past MI and + Cardiac Stents Rhythm:Regular Rate:Normal     Neuro/Psych Depression CVA, No Residual Symptoms    GI/Hepatic   Endo/Other    Renal/GU      Musculoskeletal   Abdominal   Peds  Hematology   Anesthesia Other Findings   Reproductive/Obstetrics                           Anesthesia Physical Anesthesia Plan  ASA: III  Anesthesia Plan: Spinal   Post-op Pain Management:    Induction:   Airway Management Planned: Nasal Cannula  Additional Equipment:   Intra-op Plan:   Post-operative Plan:   Informed Consent: I have reviewed the patients History and Physical, chart, labs and discussed the procedure including the risks, benefits and alternatives for the proposed anesthesia with the patient or authorized representative who has indicated his/her understanding and acceptance.     Plan Discussed with:   Anesthesia Plan Comments:         Anesthesia Quick Evaluation

## 2011-12-12 NOTE — Op Note (Signed)
Operative report  Date of surgery 12/12/2011  Primary indication unstable trimalleolar fracture right ankle  Preop diagnosis trimalleolar fracture right ankle  Postop diagnosis same  Procedure open treatment internal fixation right ankle with Synthes plate and screws and 2 medial K wires  Surgeon Romeo Apple  Assisted by Cecile Sheerer  Anesthesia spinal  Operative findings lateral malleolus and medial malleolar fractures small posterior malleolar fracture  Details of procedure: The patient was identified in the preop area the surgical site was marked after confirmation and the patient was taken to the operating room for spinal anesthetic. Intravenous Ancef was administered per weight protocol.  In the supine position the right lower extremity was prepped and draped sterilely. Completed the timeout procedure the limb was then exsanguinated with a six-inch Esmarch and the tourniquet was inflated to 300 mmHg  A straight incision was made over the lateral malleolus with full-thickness skin flaps the fracture site was debrided an open reduction was performed. The reduction was held with a bone-holding clamp and we attempted to put in an interfragmentary screw however the posterior cortex was comminuted and so this part of the procedure was aborted  With the bone clamp still holding the fracture reduced a radiograph was taken and the mortise was found to be anatomic. We contoured a one third tubular plate and then applied cortical and cancellus screws are AO technique  Radiographs confirmed reduction of the lateral malleolus posterior malleolus and medial malleolus.  A straight incision was made over the medial malleolus the fracture was reduced but was unstable and therefore to 1.25 K wires were placed. They were viewed under x-ray and found to be in good position. They were backed out from the bone bed and then buried in the bone.  Both wounds were irrigated with copious amounts of  saline.  The medial side was closed with 3-0 nylon suture using  Ruedi-Allgower stitch; the lateral side was closed with a running 0 Monocryl and staples  The skin was injected with 60 cc of Marcaine with epinephrine. A well-padded posterior splint was applied with the foot in neutral position. Prior to application of the splint the tourniquet was released  Postoperative plan the patient will followup on Monday. The sutures and staples will be removed on postop day #14 at which time an x-ray will be obtained she will then be placed in a short leg weightbearing cast. She will stay on that for a total of 4 weeks and then at the six-week mark radiographs will be repeated and if everything looks good she can go into a removable Cam Walker and start range of motion exercises

## 2011-12-12 NOTE — H&P (View-Only) (Signed)
Patient ID: Angela Lambert, female   DOB: 1946-03-13, 66 y.o.   MRN: [id number] Chief complaint RIGHT ankle pain.  Date of injury June 7.  The patient fell at her home injured her RIGHT ankle and sustained a trimalleolar fracture. Initial treatment was initiated in the emergency room.  Posterior splint was applied. Currently having 1/10 pain with tenderness swelling in the area of injury.  I have discussed the fact that this trimalleolar fracture will not hold and a cast. Furthermore, she is fairly weak on the LEFT side for stroke, and need stable fixation of the RIGHT ankle for ambulation.  She will stay with her daughter after surgery versus going to a skilled care center. The following portions of the patient's history were reviewed and updated as appropriate: allergies, current medications, past family history, past medical history, past social history, past surgical history and problem list.    Cardiologist Dr. Sheffield Slider. Primary care physician is Dr. Lodema Hong.  The patient has the following review of systems Anxiety All other systems reviewed were negative Review of Systems Pertinent items are noted in HPI.   Objective:    BP 122/72  Ht 4\' 11"  (1.499 m)  Wt 61.236 kg (135 lb)  BMI 27.27 kg/m2        Vital signs are stable as recorded  General appearance is normal  The patient is alert and oriented x3  The patient's mood and affect are normal  Gait assessment: can not walk  The cardiovascular exam reveals normal pulses and temperature without edema swelling.  The lymphatic system is negative for palpable lymph nodes  The sensory exam is normal.  There are no pathologic reflexes.  Balance is normal.  Upper extremity exam  Inspection and palpation revealed no abnormalities in the upper extremities.  Range of motion is full without contracture.  Motor exam is normal with grade 5 strength.  The joints are fully reduced without subluxation.  There is no  atrophy or tremor and muscle tone is normal.  All joints are stable.  Left Lower extremity exam  Ambulation is normal.  Inspection and palpation revealed no tenderness or abnormality in alignment in the lower extremities. Range of motion is full.  Strength is grade 5.   all joints are stable.   Exam of the right ankle  Inspection swelling bruising tenderness right ankle severe no blisters  Range of motion 0-15 DF-PF Stability unstable  Strength normal tone  Skin as stated   X-ray trimalleolar fracture right ankle   Assessment:    right trimall. Fracture    Plan:    otif/orif right ankle

## 2011-12-12 NOTE — Anesthesia Procedure Notes (Signed)
Procedure Name: MAC Date/Time: 12/12/2011 1:15 PM Performed by: Franco Nones Pre-anesthesia Checklist: Patient identified, Emergency Drugs available, Suction available, Timeout performed and Patient being monitored Patient Re-evaluated:Patient Re-evaluated prior to inductionOxygen Delivery Method: Non-rebreather mask    Spinal  Patient location during procedure: OR Start time: 12/12/2011 1:25 PM End time: 12/12/2011 1:29 PM Staffing CRNA/Resident: Minerva Areola S Preanesthetic Checklist Completed: patient identified, site marked, surgical consent, pre-op evaluation, timeout performed, IV checked, risks and benefits discussed and monitors and equipment checked Spinal Block Patient position: right lateral decubitus Prep: Betadine and prep x 3 Patient monitoring: heart rate, cardiac monitor, continuous pulse ox and blood pressure Approach: right paramedian Interspace: 1% lidocaine skinwheal 1 cc. Injection technique: single-shot Needle Needle type: Spinocan  Needle gauge: 22 G Needle length: 9 cm Assessment Sensory level: T8 (level at 1336) Additional Notes Betadine prep x 3 1% lidocaine skin wheal 1 cc Clear CSF pre and post injection   ATTEMPTS:1 TRAY [id number] TRAY EXPIRATION DATE: 2013-11

## 2011-12-12 NOTE — Transfer of Care (Signed)
Immediate Anesthesia Transfer of Care Note  Patient: Angela Lambert  Procedure(s) Performed: Procedure(s) (LRB): OPEN REDUCTION INTERNAL FIXATION (ORIF) ANKLE FRACTURE (Right)  Patient Location: PACU  Anesthesia Type: SAB  Level of Consciousness: awake  Airway & Oxygen Therapy: Patient Spontanous Breathing and non-rebreather face mask  Post-op Assessment: Report given to PACU RN, Post -op Vital signs reviewed and stable. SAB Level  T12  Post vital signs: Reviewed and stable  Complications: No apparent anesthesia complications

## 2011-12-12 NOTE — Brief Op Note (Signed)
12/12/2011  3:14 PM  PATIENT:  Angela Lambert  66 y.o. female  PRE-OPERATIVE DIAGNOSIS:  fracture right ankle  POST-OPERATIVE DIAGNOSIS:  fracture right ankle  PROCEDURE:  Procedure(s) (LRB): OPEN REDUCTION INTERNAL FIXATION (ORIF) ANKLE FRACTURE (Right)  SURGEON:  Surgeon(s) and Role:    * Vickki Hearing, MD - Primary  PHYSICIAN ASSISTANT:   ASSISTANTS: cynthia wrenn   ANESTHESIA:   spinal  EBL:  Total I/O In: 950 [I.V.:950] Out: 25 [Blood:25]  BLOOD ADMINISTERED:none  DRAINS: none   LOCAL MEDICATIONS USED:  MARCAINE   , Amount: 60 ml and OTHER epi  SPECIMEN:  No Specimen  DISPOSITION OF SPECIMEN:  N/A  COUNTS:  YES  TOURNIQUET:   Total Tourniquet Time Documented: Thigh (Right) - 65 minutes  DICTATION: .Dragon Dictation  PLAN OF CARE: Discharge to home after PACU  PATIENT DISPOSITION:  PACU - hemodynamically stable.   Delay start of Pharmacological VTE agent (>24hrs) due to surgical blood loss or risk of bleeding: not applicable

## 2011-12-16 ENCOUNTER — Encounter (HOSPITAL_COMMUNITY): Payer: Self-pay | Admitting: Orthopedic Surgery

## 2011-12-16 ENCOUNTER — Ambulatory Visit (INDEPENDENT_AMBULATORY_CARE_PROVIDER_SITE_OTHER): Payer: Medicare Other | Admitting: Orthopedic Surgery

## 2011-12-16 VITALS — BP 104/60 | Ht 62.0 in | Wt 140.0 lb

## 2011-12-16 DIAGNOSIS — S82843A Displaced bimalleolar fracture of unspecified lower leg, initial encounter for closed fracture: Secondary | ICD-10-CM

## 2011-12-16 NOTE — Patient Instructions (Addendum)
Continue ice and elevation  No weight on right leg

## 2011-12-18 NOTE — Progress Notes (Signed)
Patient ID: Angela Lambert, female   DOB: April 28, 1946, 66 y.o.   MRN: [id number] Chief Complaint  Patient presents with  . Routine Post Op    post op, cast check, DOS 12/12/11    BP 104/60  Ht 5\' 2"  (1.575 m)  Wt 63.504 kg (140 lb)  BMI 25.61 kg/m2  Status post open treatment internal fixation of the ankle.  Splint removed. Wounds look clean. She does have some swelling in the foot and ankle, but more so in the foot.  New splint applied foot in neutral position. Return for staples out suture removal and short leg cast.

## 2011-12-23 ENCOUNTER — Ambulatory Visit (INDEPENDENT_AMBULATORY_CARE_PROVIDER_SITE_OTHER): Payer: Medicare Other | Admitting: Orthopedic Surgery

## 2011-12-23 ENCOUNTER — Encounter: Payer: Self-pay | Admitting: Orthopedic Surgery

## 2011-12-23 ENCOUNTER — Ambulatory Visit (INDEPENDENT_AMBULATORY_CARE_PROVIDER_SITE_OTHER): Payer: Medicare Other

## 2011-12-23 VITALS — BP 142/80 | Ht 62.0 in | Wt 140.0 lb

## 2011-12-23 DIAGNOSIS — S82899A Other fracture of unspecified lower leg, initial encounter for closed fracture: Secondary | ICD-10-CM

## 2011-12-23 NOTE — Patient Instructions (Signed)
No weight-bearing

## 2011-12-24 NOTE — Progress Notes (Signed)
Patient ID: Angela Lambert, female   DOB: [birth year], 66 y.o.   MRN: [id number] Chief Complaint  Patient presents with  . Routine Post Op    post op 2, right ankle surgery, DOS 12/12/11   BP 142/80  Ht 5\' 2"  (1.575 m)  Wt 140 lb (63.504 kg)  BMI 25.61 kg/m2  2 weeks postop. Stitches, and staples out in x-rays today.  X-rays show maintenance of reduction and hardware positioning.  Cast placed with the foot in neutral.  Nonweightbearing.  Return in 2 weeks for new cast

## 2012-01-06 ENCOUNTER — Encounter: Payer: Self-pay | Admitting: Orthopedic Surgery

## 2012-01-06 ENCOUNTER — Ambulatory Visit (INDEPENDENT_AMBULATORY_CARE_PROVIDER_SITE_OTHER): Payer: Medicare Other

## 2012-01-06 ENCOUNTER — Ambulatory Visit (INDEPENDENT_AMBULATORY_CARE_PROVIDER_SITE_OTHER): Payer: Medicare Other | Admitting: Orthopedic Surgery

## 2012-01-06 VITALS — BP 140/70 | Ht 62.0 in | Wt 140.0 lb

## 2012-01-06 DIAGNOSIS — S82899A Other fracture of unspecified lower leg, initial encounter for closed fracture: Secondary | ICD-10-CM

## 2012-01-06 DIAGNOSIS — S82891A Other fracture of right lower leg, initial encounter for closed fracture: Secondary | ICD-10-CM

## 2012-01-06 NOTE — Progress Notes (Signed)
Patient ID: Angela Lambert, female   DOB: [birth year], 66 y.o.   MRN: [id number] Chief Complaint  Patient presents with  . Follow-up    two week recheck and xray right ankle, DOS 12/12/11    BP 140/70  Ht 5\' 2"  (1.575 m)  Wt 140 lb (63.504 kg)  BMI 25.61 kg/m2  3 weeks 4 days status post bimalleolar ankle fracture fixation, RIGHT lower extremity doing well in a cast, nonweightbearing.  X-rays today show fracture healing. Hardware in good position.  Placed in a short Cam Walker.  Start weightbearing as tolerated with the brace and walker followup 4 weeks for 8 week. X-ray.

## 2012-01-06 NOTE — Patient Instructions (Signed)
Start weight bearing in brace   Sleep in brace   Remove brace for bathing

## 2012-01-07 ENCOUNTER — Ambulatory Visit: Payer: Medicare Other | Admitting: Family Medicine

## 2012-02-03 ENCOUNTER — Ambulatory Visit: Payer: Medicare Other | Admitting: Orthopedic Surgery

## 2012-02-11 ENCOUNTER — Other Ambulatory Visit: Payer: Self-pay | Admitting: Family Medicine

## 2012-02-17 ENCOUNTER — Encounter: Payer: Self-pay | Admitting: Orthopedic Surgery

## 2012-02-17 ENCOUNTER — Ambulatory Visit (INDEPENDENT_AMBULATORY_CARE_PROVIDER_SITE_OTHER): Payer: Medicare Other | Admitting: Orthopedic Surgery

## 2012-02-17 ENCOUNTER — Ambulatory Visit (INDEPENDENT_AMBULATORY_CARE_PROVIDER_SITE_OTHER): Payer: Medicare Other

## 2012-02-17 VITALS — BP 120/80 | Ht 62.0 in | Wt 140.0 lb

## 2012-02-17 DIAGNOSIS — S82899A Other fracture of unspecified lower leg, initial encounter for closed fracture: Secondary | ICD-10-CM

## 2012-02-17 DIAGNOSIS — S82891A Other fracture of right lower leg, initial encounter for closed fracture: Secondary | ICD-10-CM

## 2012-02-17 NOTE — Patient Instructions (Signed)
Continue weightbearing as tolerated 

## 2012-02-17 NOTE — Progress Notes (Signed)
Patient ID: Angela Lambert, female   DOB: 1946/02/08, 66 y.o.   MRN: [id number] Chief Complaint  Patient presents with  . Follow-up    Date of surgery June 13, x-ray today    BP 120/80  Ht 5\' 2"  (1.575 m)  Wt 140 lb (63.504 kg)  BMI 25.61 kg/m2  This is a postoperative visit. The patient is about 8 weeks out from a right ankle fracture with internal fixation medially and laterally. She is weightbearing as tolerated in a short Cam Walker  Her x-rays look good she can continue with weightbearing and I Cam Walker for another month. X-ray at that time.  Date of surgery June 13

## 2012-03-05 LAB — BASIC METABOLIC PANEL
CO2: 33 mEq/L — ABNORMAL HIGH (ref 19–32)
Calcium: 9.7 mg/dL (ref 8.4–10.5)
Glucose, Bld: 94 mg/dL (ref 70–99)
Sodium: 143 mEq/L (ref 135–145)

## 2012-03-05 LAB — HEPATIC FUNCTION PANEL
AST: 11 U/L (ref 0–37)
Bilirubin, Direct: 0.1 mg/dL (ref 0.0–0.3)
Total Bilirubin: 0.5 mg/dL (ref 0.3–1.2)

## 2012-03-05 LAB — LIPID PANEL: Total CHOL/HDL Ratio: 2.8 Ratio

## 2012-03-12 ENCOUNTER — Ambulatory Visit (INDEPENDENT_AMBULATORY_CARE_PROVIDER_SITE_OTHER): Payer: Medicare Other

## 2012-03-12 ENCOUNTER — Encounter: Payer: Self-pay | Admitting: Orthopedic Surgery

## 2012-03-12 ENCOUNTER — Ambulatory Visit (INDEPENDENT_AMBULATORY_CARE_PROVIDER_SITE_OTHER): Payer: Medicare Other | Admitting: Orthopedic Surgery

## 2012-03-12 VITALS — BP 90/50 | Ht 62.0 in | Wt 140.0 lb

## 2012-03-12 DIAGNOSIS — S82899A Other fracture of unspecified lower leg, initial encounter for closed fracture: Secondary | ICD-10-CM

## 2012-03-12 DIAGNOSIS — S82891A Other fracture of right lower leg, initial encounter for closed fracture: Secondary | ICD-10-CM

## 2012-03-12 DIAGNOSIS — S82853A Displaced trimalleolar fracture of unspecified lower leg, initial encounter for closed fracture: Secondary | ICD-10-CM

## 2012-03-12 NOTE — Patient Instructions (Signed)
activities as tolerated 

## 2012-03-12 NOTE — Progress Notes (Signed)
Patient ID: Angela Lambert, female   DOB: [birth year], 66 y.o.   MRN: [id number] Chief Complaint  Patient presents with  . Follow-up    3 week recheck and xray, ORIF Rt ankle, DOS 12/12/11    12 week postop visit bimalleolar ankle fixation doing well  X-rays show fracture consolidation  Clinical exam shows wound healing. Adequate range of motion.  Plan followup as needed aggressive as tolerated. If she has any difficulty she will call us back and we will order physical therapy if needed

## 2012-03-17 ENCOUNTER — Encounter: Payer: Self-pay | Admitting: Family Medicine

## 2012-03-17 ENCOUNTER — Ambulatory Visit (INDEPENDENT_AMBULATORY_CARE_PROVIDER_SITE_OTHER): Payer: Medicare Other | Admitting: Family Medicine

## 2012-03-17 VITALS — BP 122/68 | HR 64 | Resp 18 | Ht 62.0 in | Wt 142.0 lb

## 2012-03-17 DIAGNOSIS — R7309 Other abnormal glucose: Secondary | ICD-10-CM

## 2012-03-17 DIAGNOSIS — Z13220 Encounter for screening for lipoid disorders: Secondary | ICD-10-CM

## 2012-03-17 DIAGNOSIS — Q782 Osteopetrosis: Secondary | ICD-10-CM

## 2012-03-17 DIAGNOSIS — E785 Hyperlipidemia, unspecified: Secondary | ICD-10-CM

## 2012-03-17 DIAGNOSIS — Z23 Encounter for immunization: Secondary | ICD-10-CM

## 2012-03-17 DIAGNOSIS — F329 Major depressive disorder, single episode, unspecified: Secondary | ICD-10-CM

## 2012-03-17 DIAGNOSIS — R7303 Prediabetes: Secondary | ICD-10-CM

## 2012-03-17 DIAGNOSIS — I10 Essential (primary) hypertension: Secondary | ICD-10-CM

## 2012-03-17 NOTE — Progress Notes (Signed)
  Subjective:    Patient ID: Angela Lambert, female    DOB: [birth year], 66 y.o.   MRN: [id number]  HPI The PT is here for follow up and re-evaluation of chronic medical conditions, medication management and review of any available recent lab and radiology data.  Preventive health is updated, specifically  Cancer screening and Immunization.   Fractured right ankle 12/06/2011, ORIF on 12/12/2011, had boot off yesterday and feels very well. The PT denies any adverse reactions to current medications since the last visit.  There are no new concerns.  There are no specific complaints       Review of Systems See HPI Denies recent fever or chills. Denies sinus pressure, nasal congestion, ear pain or sore throat. Denies chest congestion, productive cough or wheezing. Denies chest pains, palpitations and leg swelling Denies abdominal pain, nausea, vomiting,diarrhea or constipation.   Denies dysuria, frequency, hesitancy or incontinence. Denies joint pain, she does have slight  limitation in mobility of the right ankle and swelling, but 90% back to normal Denies headaches, seizures, numbness, or tingling. Denies depression, anxiety or insomnia. Denies skin break down or rash.        Objective:   Physical Exam  Patient alert and oriented and in no cardiopulmonary distress.  HEENT: No facial asymmetry, EOMI, no sinus tenderness,  oropharynx pink and moist.  Neck decreased though adequate ROM no adenopathy.  Chest: Clear to auscultation bilaterally.  CVS: S1, S2 no murmurs, no S3.  ABD: Soft non tender. Bowel sounds normal.  Ext: No edema  WU:[id number]  ROM spine, shoulders, hips and knees.Decreased ROM right ankle  Skin: Intact, no ulcerations or rash noted.  Psych: Good eye contact, normal affect. Memory intact not anxious or depressed appearing.  CNS: CN 2-12 intact, power, tone and sensation normal throughout.       Assessment & Plan:

## 2012-03-17 NOTE — Patient Instructions (Addendum)
Annual wellness in February  HBa1C, TSH, vit D and chem 7 non fasting  before next visit   Bone density scan to f/u osteoperosis  Flu vaccine today and script for zostavax. Mos cost effective to get zostavax at the pharmacy

## 2012-03-20 ENCOUNTER — Telehealth: Payer: Self-pay | Admitting: Family Medicine

## 2012-03-20 ENCOUNTER — Other Ambulatory Visit: Payer: Self-pay

## 2012-03-20 MED ORDER — ALENDRONATE SODIUM 70 MG PO TABS: ORAL_TABLET | ORAL | Status: DC

## 2012-03-20 MED ORDER — HYDROCHLOROTHIAZIDE 12.5 MG PO TABS: 12.5000 mg | ORAL_TABLET | Freq: Every day | ORAL | Status: DC

## 2012-03-20 MED ORDER — LISINOPRIL 40 MG PO TABS: ORAL_TABLET | ORAL | Status: DC

## 2012-03-20 MED ORDER — SIMVASTATIN 40 MG PO TABS: 40.0000 mg | ORAL_TABLET | Freq: Every day | ORAL | Status: DC

## 2012-03-20 MED ORDER — FLUOXETINE HCL 20 MG PO CAPS: 20.0000 mg | ORAL_CAPSULE | Freq: Every day | ORAL | Status: DC

## 2012-03-20 MED ORDER — METOPROLOL TARTRATE 50 MG PO TABS: 50.0000 mg | ORAL_TABLET | Freq: Every day | ORAL | Status: DC

## 2012-03-20 NOTE — Telephone Encounter (Signed)
rxs sent in. 

## 2012-03-31 NOTE — Assessment & Plan Note (Signed)
Recent ankle fracture with minimal trauma, needs dexa Educated re importance of calcium and D supplement also regular weight bearing physical activity

## 2012-03-31 NOTE — Assessment & Plan Note (Signed)
Hyperlipidemia:Low fat diet discussed and encouraged.  Controlled, no change in medication   

## 2012-03-31 NOTE — Assessment & Plan Note (Signed)
Controlled, no change in medication DASH diet and commitment to daily physical activity for a minimum of 30 minutes discussed and encouraged, as a part of hypertension management. The importance of attaining a healthy weight is also discussed.  

## 2012-03-31 NOTE — Assessment & Plan Note (Signed)
Controlled, no change in medication  

## 2012-08-07 ENCOUNTER — Other Ambulatory Visit: Payer: Self-pay | Admitting: Family Medicine

## 2012-08-07 DIAGNOSIS — Z139 Encounter for screening, unspecified: Secondary | ICD-10-CM

## 2012-08-12 ENCOUNTER — Other Ambulatory Visit: Payer: Self-pay | Admitting: Family Medicine

## 2012-08-12 LAB — BASIC METABOLIC PANEL
BUN: 16 mg/dL (ref 6–23)
Calcium: 9.7 mg/dL (ref 8.4–10.5)
Glucose, Bld: 96 mg/dL (ref 70–99)
Potassium: 4.4 mEq/L (ref 3.5–5.3)

## 2012-08-12 LAB — TSH: TSH: 4.571 u[IU]/mL — ABNORMAL HIGH (ref 0.350–4.500)

## 2012-08-12 LAB — HEMOGLOBIN A1C: Mean Plasma Glucose: 128 mg/dL — ABNORMAL HIGH (ref <117)

## 2012-08-13 LAB — VITAMIN D 25 HYDROXY (VIT D DEFICIENCY, FRACTURES): Vit D, 25-Hydroxy: 51 ng/mL (ref 30–89)

## 2012-08-17 ENCOUNTER — Ambulatory Visit (INDEPENDENT_AMBULATORY_CARE_PROVIDER_SITE_OTHER): Payer: Medicare Other | Admitting: Family Medicine

## 2012-08-17 ENCOUNTER — Encounter: Payer: Self-pay | Admitting: Family Medicine

## 2012-08-17 VITALS — BP 130/80 | HR 71 | Resp 16 | Ht 62.0 in | Wt 154.0 lb

## 2012-08-17 DIAGNOSIS — R5383 Other fatigue: Secondary | ICD-10-CM

## 2012-08-17 DIAGNOSIS — E785 Hyperlipidemia, unspecified: Secondary | ICD-10-CM

## 2012-08-17 DIAGNOSIS — I10 Essential (primary) hypertension: Secondary | ICD-10-CM

## 2012-08-17 DIAGNOSIS — R7301 Impaired fasting glucose: Secondary | ICD-10-CM

## 2012-08-17 DIAGNOSIS — R5381 Other malaise: Secondary | ICD-10-CM

## 2012-08-17 DIAGNOSIS — Z0000 Encounter for general adult medical examination without abnormal findings: Secondary | ICD-10-CM

## 2012-08-17 NOTE — Assessment & Plan Note (Addendum)
Annual wellness completed as documented. Pt is highly functioning, has mild limitation in mobility, needs to commit to more regular physical activity, as well as to reduced carb intake and weight loss She will discuss end of life issues with her son who has HCPOA, she is fairly clear in what she does want done , and not want done

## 2012-08-17 NOTE — Progress Notes (Signed)
Subjective:    Patient ID: Angela Lambert, female    DOB: 03/19/1946, 67 y.o.   MRN: [id number]  HPI  Preventive Screening-Counseling & Management   Patient present here today for a Medicare annual wellness visit.   Current Problems (verified)   Medications Prior to Visit Allergies (verified)   PAST HISTORY  Family History  Social History Divorced, mother of 2 adult children. Lives with her partner for the past 6 years Quit tobacco 2005, no alcohol or street drug. 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 No  Over the past two weeks, have you felt little interest or pleasure in doing things? No  Have you lost interest or pleasure in daily life? No  Do you often feel hopeless? No  Do you cry easily over simple problems? No   Activities of Daily Living  In your present state of health, do you have any difficulty performing the following activities?  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 Managing money?: No Feeding yourself?:No Getting from bed to chair?:No Climbing a flight of stairs?:yes, needs a railing Preparing food and eating?:No Bathing or showering?:No Getting dressed?:No Getting to the toilet?:No Using the toilet?:No Moving around from place to place?: yes ambulates with a cane  Fall Risk Assessment In the past year have you fallen or had a near fall?:yes fell in a ditch ambulates with a cane whenever outside of her house Are you currently taking any medications that make you dizziness?:No   Hearing Difficulties: No Do you often  ask people to speak up or repeat themselves?:No Do you experience ringing or noises in your ears?:No Do you have difficulty understanding soft or whispered voices?:No  Cognitive Testing  Alert? Yes Normal Appearance?Yes  Oriented to person? Yes Place? Yes  Time? Yes  Displays appropriate judgment?Yes  Can read the correct time from a watch face? yes Are you having problems remembering things?Occasionally  Advanced Directives have been discussed with the patient?Yes , full code with limitation that does not want to be in persistent vegetative state/brain dead, will discuss and document with her son who has HCPOA Angela Lambert)   List the Names of Other Physician/Practitioners you currently use: Dr Romeo Apple 12/2011, currently discharged, had  ankle fracture corrected, surgically. Dr Janene Madeira any recent Medical Services you may have received from other than [facility] providers in the past year (date may be approximate).   Assessment:    Annual Wellness Exam   Plan:    During the course of the visit the patient was educated and counseled about appropriate screening and preventive services including:  A healthy diet is rich in fruit, vegetables and whole grains. Poultry fish, nuts and beans are a healthy choice for protein rather then red meat. A low sodium diet and drinking 64 ounces of water daily is generally recommended. Oils and sweet should be limited. Carbohydrates especially for those who are diabetic or overweight, should be limited to 30-45 gram per meal. It is important to eat on a regular  schedule, at least 3 times daily. Snacks should be primarily fruits, vegetables or nuts. It is important that you exercise regularly at least 30 minutes 5 times a week. If you develop chest pain, have severe difficulty breathing, or feel very tired, stop exercising immediately and seek medical attention  Immunization reviewed and updated. Cancer screening reviewed and  updated    Patient Instructions (the written plan) was given to the patient.  Medicare Attestation  I have personally reviewed:  The patient's medical and social history  Their use of alcohol, tobacco or illicit drugs  Their current medications and supplements  The patient's functional ability including ADLs,fall risks, home safety risks, cognitive, and hearing and visual impairment  Diet and physical activities  Evidence for depression or mood disorders  The patient's weight, height, BMI, and visual acuity have been recorded in the chart. I have made referrals, counseling, and provided education to the patient based on review of the above and I have provided the patient with a written personalized care plan for preventive services.     Review of Systems     Objective:   Physical Exam        Assessment & Plan:

## 2012-08-17 NOTE — Patient Instructions (Addendum)
F./U in 5 month, please call if you need me before  Please work on reducing carbs, and sweets and commit tom exercise 5 days per week for 30 mins total per day.  Blood sugar has improved, keep this up.  Fasting lipid, cmp and EGFR, hBa1C , THs cbc in 5 month  You will get a copy of the MOST form to discuss with your son

## 2012-08-31 ENCOUNTER — Ambulatory Visit (HOSPITAL_COMMUNITY)
Admission: RE | Admit: 2012-08-31 | Discharge: 2012-08-31 | Disposition: A | Discharge status: Home / Self Care | Payer: Medicare Other | Source: Ambulatory Visit | Attending: Family Medicine | Admitting: Family Medicine

## 2012-08-31 DIAGNOSIS — Z139 Encounter for screening, unspecified: Secondary | ICD-10-CM

## 2012-08-31 DIAGNOSIS — Z1231 Encounter for screening mammogram for malignant neoplasm of breast: Secondary | ICD-10-CM | POA: Insufficient documentation

## 2012-09-07 ENCOUNTER — Other Ambulatory Visit: Payer: Self-pay | Admitting: Family Medicine

## 2012-10-31 ENCOUNTER — Other Ambulatory Visit: Payer: Self-pay | Admitting: Family Medicine

## 2013-01-07 LAB — LIPID PANEL: Cholesterol: 174 mg/dL (ref 0–200)

## 2013-01-07 LAB — CBC WITH DIFFERENTIAL/PLATELET
Basophils Absolute: 0 10*3/uL (ref 0.0–0.1)
Eosinophils Absolute: 0.2 10*3/uL (ref 0.0–0.7)
Eosinophils Relative: 2 % (ref 0–5)
HCT: 37.9 % (ref 36.0–46.0)
Lymphocytes Relative: 33 % (ref 12–46)
MCH: 28.8 pg (ref 26.0–34.0)
MCHC: 34.8 g/dL (ref 30.0–36.0)
MCV: 82.6 fL (ref 78.0–100.0)
Monocytes Absolute: 0.5 10*3/uL (ref 0.1–1.0)
RDW: 13.9 % (ref 11.5–15.5)
WBC: 6.5 10*3/uL (ref 4.0–10.5)

## 2013-01-07 LAB — COMPLETE METABOLIC PANEL WITH GFR
Albumin: 4.5 g/dL (ref 3.5–5.2)
BUN: 18 mg/dL (ref 6–23)
CO2: 33 mEq/L — ABNORMAL HIGH (ref 19–32)
Calcium: 10 mg/dL (ref 8.4–10.5)
Chloride: 100 mEq/L (ref 96–112)
GFR, Est African American: 78 mL/min
GFR, Est Non African American: 68 mL/min
Glucose, Bld: 96 mg/dL (ref 70–99)
Potassium: 4.4 mEq/L (ref 3.5–5.3)
Sodium: 141 mEq/L (ref 135–145)
Total Protein: 6.5 g/dL (ref 6.0–8.3)

## 2013-01-07 LAB — HEMOGLOBIN A1C: Hgb A1c MFr Bld: 5.6 % (ref <5.7)

## 2013-01-14 ENCOUNTER — Ambulatory Visit (INDEPENDENT_AMBULATORY_CARE_PROVIDER_SITE_OTHER): Payer: Medicare Other | Admitting: Family Medicine

## 2013-01-14 ENCOUNTER — Encounter: Payer: Self-pay | Admitting: Family Medicine

## 2013-01-14 VITALS — BP 138/80 | HR 61 | Resp 16 | Ht 62.0 in | Wt 151.0 lb

## 2013-01-14 DIAGNOSIS — Q782 Osteopetrosis: Secondary | ICD-10-CM

## 2013-01-14 DIAGNOSIS — F329 Major depressive disorder, single episode, unspecified: Secondary | ICD-10-CM

## 2013-01-14 DIAGNOSIS — E785 Hyperlipidemia, unspecified: Secondary | ICD-10-CM

## 2013-01-14 DIAGNOSIS — R7309 Other abnormal glucose: Secondary | ICD-10-CM

## 2013-01-14 DIAGNOSIS — R7303 Prediabetes: Secondary | ICD-10-CM

## 2013-01-14 DIAGNOSIS — Z1211 Encounter for screening for malignant neoplasm of colon: Secondary | ICD-10-CM

## 2013-01-14 DIAGNOSIS — I10 Essential (primary) hypertension: Secondary | ICD-10-CM

## 2013-01-14 NOTE — Patient Instructions (Addendum)
F/u  In January, call if you need me before  Fasting lipid, cmp and hBa1C in  January , 3 to 5 days before visit  Congrats on normalizing blood sugar and please cut back on fried and fatty foods, your bad cholesterol is slightly high  Please call in October for your flu vaccine  Rectal today is normal

## 2013-01-16 ENCOUNTER — Encounter: Payer: Self-pay | Admitting: Family Medicine

## 2013-01-16 NOTE — Assessment & Plan Note (Signed)
Continue calcium and fosamax. Dexa not due at this time

## 2013-01-16 NOTE — Assessment & Plan Note (Signed)
Controlled, no change in medication  

## 2013-01-16 NOTE — Assessment & Plan Note (Signed)
Deteriorated, LDL elevated, pt to reduce fried and fatty foods and red meat and cheese, no med change

## 2013-01-16 NOTE — Assessment & Plan Note (Signed)
Controlled, no change in medication DASH diet and commitment to daily physical activity for a minimum of 30 minutes discussed and encouraged, as a part of hypertension management. The importance of attaining a healthy weight is also discussed.  

## 2013-01-16 NOTE — Assessment & Plan Note (Signed)
Improved, normal HBA1C in July, pt congratulated Patient educated about the importance of limiting  Carbohydrate intake , the need to commit to daily physical activity for a minimum of 30 minutes , and to commit weight loss. The fact that changes in all these areas will reduce or eliminate all together the development of diabetes is stressed.

## 2013-01-16 NOTE — Progress Notes (Signed)
  Subjective:    Patient ID: Angela Lambert, female    DOB: 1945/11/05, 67 y.o.   MRN: [id number]  HPI The PT is here for follow up and re-evaluation of chronic medical conditions, medication management and review of any available recent lab and radiology data.  Preventive health is updated, specifically  Cancer screening and Immunization.    The PT denies any adverse reactions to current medications since the last visit.  There are no new concerns.  There are no specific complaints       Review of Systems See HPI Denies recent fever or chills. Denies sinus pressure, nasal congestion, ear pain or sore throat. Denies chest congestion, productive cough or wheezing. Denies chest pains, palpitations and leg swelling Denies abdominal pain, nausea, vomiting,diarrhea or constipation.   Denies dysuria, frequency, hesitancy or incontinence. Denies joint pain or , swelling  Has chronic limitation in mobility, due to previous stroke Denies headaches, seizures, numbness, or tingling. Denies uncontrolled  depression, anxiety or insomnia. Denies skin break down or rash.        Objective:   Physical Exam  Patient alert and oriented and in no cardiopulmonary distress.  HEENT: No facial asymmetry, EOMI, no sinus tenderness,  oropharynx pink and moist.  Neck supple no adenopathy.  Chest: Clear to auscultation bilaterally.  CVS: S1, S2 no murmurs, no S3.  ABD: Soft non tender. Bowel sounds normal. Rectal: no mass, heme negative stool Ext: No edema  MS: Adequate though reduced  ROM spine, shoulders, hips and knees.  Skin: Intact, no ulcerations or rash noted.  Psych: Good eye contact, normal affect. Memory intact not anxious or depressed appearing.  CNS: CN 2-12 intact, reduced power in right upper and lower extremities       Assessment & Plan:

## 2013-02-10 ENCOUNTER — Telehealth: Payer: Self-pay | Admitting: Family Medicine

## 2013-02-10 MED ORDER — ALENDRONATE SODIUM 70 MG PO TABS: ORAL_TABLET | ORAL | Status: DC

## 2013-02-10 MED ORDER — LISINOPRIL 40 MG PO TABS: ORAL_TABLET | ORAL | Status: DC

## 2013-02-10 MED ORDER — FLUOXETINE HCL 20 MG PO CAPS: ORAL_CAPSULE | ORAL | Status: DC

## 2013-02-10 MED ORDER — METOPROLOL TARTRATE 50 MG PO TABS: 50.0000 mg | ORAL_TABLET | Freq: Every day | ORAL | Status: AC

## 2013-02-10 MED ORDER — HYDROCHLOROTHIAZIDE 12.5 MG PO CAPS: ORAL_CAPSULE | ORAL | Status: DC

## 2013-02-10 MED ORDER — SIMVASTATIN 40 MG PO TABS: ORAL_TABLET | ORAL | Status: DC

## 2013-02-10 NOTE — Telephone Encounter (Signed)
All meds refilled.

## 2013-03-24 ENCOUNTER — Ambulatory Visit (INDEPENDENT_AMBULATORY_CARE_PROVIDER_SITE_OTHER): Payer: Medicare Other

## 2013-03-24 DIAGNOSIS — Z23 Encounter for immunization: Secondary | ICD-10-CM

## 2013-07-16 ENCOUNTER — Other Ambulatory Visit: Payer: Self-pay | Admitting: Family Medicine

## 2013-07-16 LAB — LIPID PANEL
CHOLESTEROL: 163 mg/dL (ref 0–200)
HDL: 48 mg/dL (ref >39)
LDL Cholesterol: 95 mg/dL (ref 0–99)
Total CHOL/HDL Ratio: 3.4 Ratio
Triglycerides: 102 mg/dL (ref <150)
VLDL: 20 mg/dL (ref 0–40)

## 2013-07-16 LAB — COMPREHENSIVE METABOLIC PANEL
ALT: 27 U/L (ref 0–35)
AST: 16 U/L (ref 0–37)
Albumin: 4.3 g/dL (ref 3.5–5.2)
Alkaline Phosphatase: 32 U/L — ABNORMAL LOW (ref 39–117)
BUN: 17 mg/dL (ref 6–23)
CALCIUM: 9.6 mg/dL (ref 8.4–10.5)
CHLORIDE: 101 meq/L (ref 96–112)
CO2: 31 meq/L (ref 19–32)
Creat: 0.84 mg/dL (ref 0.50–1.10)
Glucose, Bld: 93 mg/dL (ref 70–99)
POTASSIUM: 4.3 meq/L (ref 3.5–5.3)
SODIUM: 141 meq/L (ref 135–145)
TOTAL PROTEIN: 6.7 g/dL (ref 6.0–8.3)
Total Bilirubin: 0.8 mg/dL (ref 0.3–1.2)

## 2013-07-16 LAB — HEMOGLOBIN A1C
HEMOGLOBIN A1C: 6.1 % — AB (ref <5.7)
MEAN PLASMA GLUCOSE: 128 mg/dL — AB (ref <117)

## 2013-07-19 ENCOUNTER — Other Ambulatory Visit: Payer: Self-pay

## 2013-07-19 ENCOUNTER — Ambulatory Visit (INDEPENDENT_AMBULATORY_CARE_PROVIDER_SITE_OTHER): Payer: Medicare HMO | Admitting: Family Medicine

## 2013-07-19 ENCOUNTER — Encounter: Payer: Self-pay | Admitting: Family Medicine

## 2013-07-19 VITALS — BP 130/70 | HR 70 | Resp 16 | Ht 62.0 in | Wt 157.1 lb

## 2013-07-19 DIAGNOSIS — E8881 Metabolic syndrome: Secondary | ICD-10-CM

## 2013-07-19 DIAGNOSIS — R7309 Other abnormal glucose: Secondary | ICD-10-CM

## 2013-07-19 DIAGNOSIS — I10 Essential (primary) hypertension: Secondary | ICD-10-CM

## 2013-07-19 DIAGNOSIS — F411 Generalized anxiety disorder: Secondary | ICD-10-CM

## 2013-07-19 DIAGNOSIS — I69993 Ataxia following unspecified cerebrovascular disease: Secondary | ICD-10-CM

## 2013-07-19 DIAGNOSIS — R7303 Prediabetes: Secondary | ICD-10-CM

## 2013-07-19 DIAGNOSIS — E663 Overweight: Secondary | ICD-10-CM | POA: Insufficient documentation

## 2013-07-19 DIAGNOSIS — E785 Hyperlipidemia, unspecified: Secondary | ICD-10-CM

## 2013-07-19 MED ORDER — FLUOXETINE HCL 20 MG PO CAPS: ORAL_CAPSULE | ORAL | Status: AC

## 2013-07-19 MED ORDER — LISINOPRIL 40 MG PO TABS: ORAL_TABLET | ORAL | Status: AC

## 2013-07-19 MED ORDER — SIMVASTATIN 40 MG PO TABS: ORAL_TABLET | ORAL | Status: AC

## 2013-07-19 MED ORDER — HYDROCHLOROTHIAZIDE 12.5 MG PO CAPS: ORAL_CAPSULE | ORAL | Status: AC

## 2013-07-19 NOTE — Assessment & Plan Note (Signed)
Controlled, no change in medication DASH diet and commitment to daily physical activity for a minimum of 30 minutes discussed and encouraged, as a part of hypertension management. The importance of attaining a healthy weight is also discussed.  

## 2013-07-19 NOTE — Assessment & Plan Note (Signed)
Controlled, no change in medication Hyperlipidemia:Low fat diet discussed and encouraged.  \ 

## 2013-07-19 NOTE — Assessment & Plan Note (Signed)
The increased risk of cardiovascular disease associated with this diagnosis, and the need to consistently work on lifestyle to change this is discussed. Following  a  heart healthy diet ,commitment to 30 minutes of exercise at least 5 days per week, as well as control of blood sugar and cholesterol , and achieving a healthy weight are all the areas to be addressed .  

## 2013-07-19 NOTE — Patient Instructions (Signed)
F/u early May with EKG at that visit, call if you need me before  Please commit to improving blood sugar and losing weight to reduce yioour risk of heart disease.  No new medication today  Non fasting HBA1C, and chem 77 in May before visit

## 2013-07-19 NOTE — Assessment & Plan Note (Signed)
Controlled on fluoetine continue same, negative depression screen today

## 2013-07-19 NOTE — Assessment & Plan Note (Addendum)
Deteriorated , will work on this, rept in 4 month Patient educated about the importance of limiting  Carbohydrate intake , the need to commit to daily physical activity for a minimum of 30 minutes , and to commit weight loss. The fact that changes in all these areas will reduce or eliminate all together the development of diabetes is stressed.

## 2013-07-19 NOTE — Progress Notes (Signed)
   Subjective:    Patient ID: Angela Lambert, female    DOB: 15-May-1946, 68 y.o.   MRN: [id number]  HPI The PT is here for follow up and re-evaluation of chronic medical conditions, medication management and review of any available recent lab and radiology data.  Preventive health is updated, specifically  Cancer screening and Immunization.    The PT denies any adverse reactions to current medications since the last visit.  There are no new concerns.  There are no specific complaints       Review of Systems See HPI Denies recent fever or chills. Denies sinus pressure, nasal congestion, ear pain or sore throat. Denies chest congestion, productive cough or wheezing. Denies chest pains, palpitations and leg swelling Denies abdominal pain, nausea, vomiting,diarrhea or constipation.   Denies dysuria, frequency, hesitancy or incontinence. Denies joint pain, swelling has chronic  limitation in mobility. Denies headaches, seizures, numbness, or tingling. Denies depression,uncontrolled  anxiety or insomnia. Denies skin break down or rash.        Objective:   Physical Exam Patient alert and oriented and in no cardiopulmonary distress.  HEENT: No facial asymmetry, EOMI, no sinus tenderness,  oropharynx pink and moist.  Neck supple no adenopathy.  Chest: Clear to auscultation bilaterally.  CVS: S1, S2 no murmurs, no S3.  ABD: Soft non tender. Bowel sounds normal.  Ext: No edema  MS: Adequate ROM spine, shoulders, decreased in  hips and knees.  Skin: Intact, no ulcerations or rash noted.  Psych: Good eye contact, normal affect. Memory intact not anxious or depressed appearing.  CNS: CN 2-12 intact, power, tone and sensation normal throughout.        Assessment & Plan:

## 2013-07-19 NOTE — Addendum Note (Signed)
Addended by: Eual Fines on: 07/19/2013 10:05 AM   Modules accepted: Orders

## 2013-07-19 NOTE — Assessment & Plan Note (Signed)
Deteriorated. Patient re-educated about  the importance of commitment to a  minimum of 150 minutes of exercise per week. The importance of healthy food choices with portion control discussed. Encouraged to start a food diary, count calories and to consider  joining a support group. Sample diet sheets offered. Goals set by the patient for the next several months.    

## 2013-07-19 NOTE — Assessment & Plan Note (Signed)
Increased fall risk ambulated with cane , no falls in past 12 months, does  Not use cane in the home

## 2013-07-28 ENCOUNTER — Other Ambulatory Visit: Payer: Self-pay | Admitting: Family Medicine

## 2013-08-04 ENCOUNTER — Other Ambulatory Visit: Payer: Self-pay | Admitting: Family Medicine

## 2013-08-04 DIAGNOSIS — Z139 Encounter for screening, unspecified: Secondary | ICD-10-CM

## 2013-09-06 ENCOUNTER — Ambulatory Visit (HOSPITAL_COMMUNITY)
Admission: RE | Admit: 2013-09-06 | Discharge: 2013-09-06 | Disposition: A | Discharge status: Home / Self Care | Payer: Medicare HMO | Source: Ambulatory Visit | Attending: Family Medicine | Admitting: Family Medicine

## 2013-09-06 DIAGNOSIS — Z1231 Encounter for screening mammogram for malignant neoplasm of breast: Secondary | ICD-10-CM | POA: Insufficient documentation

## 2013-09-06 DIAGNOSIS — Z139 Encounter for screening, unspecified: Secondary | ICD-10-CM

## 2013-10-19 LAB — BASIC METABOLIC PANEL
BUN: 17 mg/dL (ref 6–23)
CALCIUM: 9.4 mg/dL (ref 8.4–10.5)
CO2: 30 mEq/L (ref 19–32)
Chloride: 101 mEq/L (ref 96–112)
Creat: 0.83 mg/dL (ref 0.50–1.10)
Glucose, Bld: 100 mg/dL — ABNORMAL HIGH (ref 70–99)
POTASSIUM: 3.7 meq/L (ref 3.5–5.3)
SODIUM: 141 meq/L (ref 135–145)

## 2013-10-19 LAB — HEMOGLOBIN A1C
HEMOGLOBIN A1C: 5.9 % — AB (ref <5.7)
Mean Plasma Glucose: 123 mg/dL — ABNORMAL HIGH (ref <117)

## 2013-10-29 ENCOUNTER — Ambulatory Visit: Payer: Medicare HMO | Admitting: Family Medicine

## 2013-11-05 ENCOUNTER — Ambulatory Visit: Payer: Medicare HMO | Admitting: Family Medicine

## 2013-11-08 ENCOUNTER — Encounter (HOSPITAL_COMMUNITY): Payer: Self-pay | Admitting: Pharmacy Technician

## 2013-11-16 ENCOUNTER — Encounter (HOSPITAL_COMMUNITY)
Admission: RE | Admit: 2013-11-16 | Discharge: 2013-11-16 | Disposition: A | Discharge status: Home / Self Care | Payer: Medicare HMO | Source: Ambulatory Visit | Attending: Ophthalmology | Admitting: Ophthalmology

## 2013-11-16 ENCOUNTER — Other Ambulatory Visit: Payer: Self-pay

## 2013-11-16 ENCOUNTER — Encounter (HOSPITAL_COMMUNITY): Payer: Self-pay

## 2013-11-16 DIAGNOSIS — Z01812 Encounter for preprocedural laboratory examination: Secondary | ICD-10-CM | POA: Insufficient documentation

## 2013-11-16 DIAGNOSIS — Z01810 Encounter for preprocedural cardiovascular examination: Secondary | ICD-10-CM | POA: Insufficient documentation

## 2013-11-16 HISTORY — DX: Personal history of other diseases of the digestive system: Z87.19

## 2013-11-16 LAB — BASIC METABOLIC PANEL
BUN: 19 mg/dL (ref 6–23)
CHLORIDE: 102 meq/L (ref 96–112)
CO2: 30 mEq/L (ref 19–32)
Calcium: 9.9 mg/dL (ref 8.4–10.5)
Creatinine, Ser: 0.88 mg/dL (ref 0.50–1.10)
GFR calc Af Amer: 77 mL/min — ABNORMAL LOW (ref >90)
GFR calc non Af Amer: 66 mL/min — ABNORMAL LOW (ref >90)
GLUCOSE: 109 mg/dL — AB (ref 70–99)
POTASSIUM: 4.1 meq/L (ref 3.7–5.3)
SODIUM: 144 meq/L (ref 137–147)

## 2013-11-16 LAB — HEMOGLOBIN AND HEMATOCRIT, BLOOD
HCT: 39.3 % (ref 36.0–46.0)
Hemoglobin: 13.4 g/dL (ref 12.0–15.0)

## 2013-11-16 NOTE — Patient Instructions (Signed)
Your procedure is scheduled on: 11/23/2013  Report to [facility] at  62  AM.  Call this number if you have problems the morning of surgery: [phone]   Do not eat food or drink liquids :After Midnight.      Take these medicines the morning of surgery with A SIP OF WATER: prozac, microzide, lisinopril, metoprolol   Do not wear jewelry, make-up or nail polish.  Do not wear lotions, powders, or perfumes.   Do not shave 48 hours prior to surgery.  Do not bring valuables to the hospital.  Contacts, dentures or bridgework may not be worn into surgery.  Leave suitcase in the car. After surgery it may be brought to your room.  For patients admitted to the hospital, checkout time is 11:00 AM the day of discharge.   Patients discharged the day of surgery will not be allowed to drive home.  :     Please read over the following fact sheets that you were given: Coughing and Deep Breathing, Surgical Site Infection Prevention, Anesthesia Post-op Instructions and Care and Recovery After Surgery    Cataract A cataract is a clouding of the lens of the eye. When a lens becomes cloudy, vision is reduced based on the degree and nature of the clouding. Many cataracts reduce vision to some degree. Some cataracts make people more near-sighted as they develop. Other cataracts increase glare. Cataracts that are ignored and become worse can sometimes look white. The white color can be seen through the pupil. CAUSES   Aging. However, cataracts may occur at any age, even in newborns.   Certain drugs.   Trauma to the eye.   Certain diseases such as diabetes.   Specific eye diseases such as chronic inflammation inside the eye or a sudden attack of a rare form of glaucoma.   Inherited or acquired medical problems.  SYMPTOMS   Gradual, progressive drop in vision in the affected eye.   Severe, rapid visual loss. This most often happens when trauma is the cause.  DIAGNOSIS  To detect a cataract, an eye  doctor examines the lens. Cataracts are best diagnosed with an exam of the eyes with the pupils enlarged (dilated) by drops.  TREATMENT  For an early cataract, vision may improve by using different eyeglasses or stronger lighting. If that does not help your vision, surgery is the only effective treatment. A cataract needs to be surgically removed when vision loss interferes with your everyday activities, such as driving, reading, or watching TV. A cataract may also have to be removed if it prevents examination or treatment of another eye problem. Surgery removes the cloudy lens and usually replaces it with a substitute lens (intraocular lens, IOL).  At a time when both you and your doctor agree, the cataract will be surgically removed. If you have cataracts in both eyes, only one is usually removed at a time. This allows the operated eye to heal and be out of danger from any possible problems after surgery (such as infection or poor wound healing). In rare cases, a cataract may be doing damage to your eye. In these cases, your caregiver may advise surgical removal right away. The vast majority of people who have cataract surgery have better vision afterward. HOME CARE INSTRUCTIONS  If you are not planning surgery, you may be asked to do the following:  Use different eyeglasses.   Use stronger or brighter lighting.   Ask your eye doctor about reducing your medicine dose or  changing medicines if it is thought that a medicine caused your cataract. Changing medicines does not make the cataract go away on its own.   Become familiar with your surroundings. Poor vision can lead to injury. Avoid bumping into things on the affected side. You are at a higher risk for tripping or falling.   Exercise extreme care when driving or operating machinery.   Wear sunglasses if you are sensitive to bright light or experiencing problems with glare.  SEEK IMMEDIATE MEDICAL CARE IF:   You have a worsening or sudden  vision loss.   You notice redness, swelling, or increasing pain in the eye.   You have a fever.  Document Released: 06/17/2005 Document Revised: 06/06/2011 Document Reviewed: 02/08/2011 [employer] Patient Information 2012 [place].PATIENT INSTRUCTIONS POST-ANESTHESIA  IMMEDIATELY FOLLOWING SURGERY:  Do not drive or operate machinery for the first twenty four hours after surgery.  Do not make any important decisions for twenty four hours after surgery or while taking narcotic pain medications or sedatives.  If you develop intractable nausea and vomiting or a severe headache please notify your doctor immediately.  FOLLOW-UP:  Please make an appointment with your surgeon as instructed. You do not need to follow up with anesthesia unless specifically instructed to do so.  WOUND CARE INSTRUCTIONS (if applicable):  Keep a dry clean dressing on the anesthesia/puncture wound site if there is drainage.  Once the wound has quit draining you may leave it open to air.  Generally you should leave the bandage intact for twenty four hours unless there is drainage.  If the epidural site drains for more than 36-48 hours please call the anesthesia department.  QUESTIONS?:  Please feel free to call your physician or the hospital operator if you have any questions, and they will be happy to assist you.

## 2013-11-17 ENCOUNTER — Encounter (HOSPITAL_COMMUNITY): Payer: Self-pay | Admitting: Pharmacy Technician

## 2013-11-23 ENCOUNTER — Encounter (HOSPITAL_COMMUNITY)
Admission: RE | Disposition: A | Discharge status: Home / Self Care | Payer: Self-pay | Source: Ambulatory Visit | Attending: Ophthalmology

## 2013-11-23 ENCOUNTER — Encounter (HOSPITAL_COMMUNITY): Payer: Self-pay | Admitting: *Deleted

## 2013-11-23 ENCOUNTER — Encounter (HOSPITAL_COMMUNITY): Payer: Medicare HMO | Admitting: Anesthesiology

## 2013-11-23 ENCOUNTER — Ambulatory Visit (HOSPITAL_COMMUNITY)
Admission: RE | Admit: 2013-11-23 | Discharge: 2013-11-23 | Disposition: A | Discharge status: Home / Self Care | Payer: Medicare HMO | Source: Ambulatory Visit | Attending: Ophthalmology | Admitting: Ophthalmology

## 2013-11-23 ENCOUNTER — Ambulatory Visit (HOSPITAL_COMMUNITY): Payer: Medicare HMO | Admitting: Anesthesiology

## 2013-11-23 DIAGNOSIS — H2589 Other age-related cataract: Secondary | ICD-10-CM | POA: Insufficient documentation

## 2013-11-23 HISTORY — PX: CATARACT EXTRACTION W/PHACO: SHX586

## 2013-11-23 SURGERY — PHACOEMULSIFICATION, CATARACT, WITH IOL INSERTION
Anesthesia: Monitor Anesthesia Care | Site: Eye | Laterality: Right

## 2013-11-23 MED ORDER — CYCLOPENTOLATE-PHENYLEPHRINE OP SOLN OPTIME - NO CHARGE
OPHTHALMIC | Status: AC
  Filled 2013-11-23: qty 2

## 2013-11-23 MED ORDER — FENTANYL CITRATE 0.05 MG/ML IJ SOLN
25.0000 ug | INTRAMUSCULAR | Status: AC
  Administered 2013-11-23: 25 ug via INTRAVENOUS

## 2013-11-23 MED ORDER — BSS IO SOLN
INTRAOCULAR | Status: DC | PRN
  Administered 2013-11-23: 15 mL via INTRAOCULAR

## 2013-11-23 MED ORDER — TETRACAINE HCL 0.5 % OP SOLN
OPHTHALMIC | Status: AC
  Filled 2013-11-23: qty 2

## 2013-11-23 MED ORDER — BSS IO SOLN
INTRAOCULAR | Status: DC | PRN
  Administered 2013-11-23: 10:00:00

## 2013-11-23 MED ORDER — PROVISC 10 MG/ML IO SOLN
INTRAOCULAR | Status: DC | PRN
  Administered 2013-11-23: 0.85 mL via INTRAOCULAR

## 2013-11-23 MED ORDER — CYCLOPENTOLATE-PHENYLEPHRINE 0.2-1 % OP SOLN
1.0000 [drp] | OPHTHALMIC | Status: AC | PRN
  Administered 2013-11-23 (×3): 1 [drp] via OPHTHALMIC

## 2013-11-23 MED ORDER — LACTATED RINGERS IV SOLN
INTRAVENOUS | Status: DC
  Administered 2013-11-23: 09:00:00 via INTRAVENOUS

## 2013-11-23 MED ORDER — MIDAZOLAM HCL 2 MG/2ML IJ SOLN
1.0000 mg | INTRAMUSCULAR | Status: DC | PRN
  Administered 2013-11-23: 2 mg via INTRAVENOUS

## 2013-11-23 MED ORDER — TETRACAINE 0.5 % OP SOLN OPTIME - NO CHARGE
OPHTHALMIC | Status: DC | PRN
  Administered 2013-11-23: 1 [drp] via OPHTHALMIC

## 2013-11-23 MED ORDER — KETOROLAC TROMETHAMINE 0.5 % OP SOLN
OPHTHALMIC | Status: AC
  Filled 2013-11-23: qty 5

## 2013-11-23 MED ORDER — FENTANYL CITRATE 0.05 MG/ML IJ SOLN: 25.0000 ug | INTRAMUSCULAR | Status: DC | PRN

## 2013-11-23 MED ORDER — TETRACAINE HCL 0.5 % OP SOLN
1.0000 [drp] | OPHTHALMIC | Status: AC | PRN
  Administered 2013-11-23 (×3): 1 [drp] via OPHTHALMIC

## 2013-11-23 MED ORDER — KETOROLAC TROMETHAMINE 0.5 % OP SOLN
1.0000 [drp] | OPHTHALMIC | Status: AC | PRN
  Administered 2013-11-23 (×3): 1 [drp] via OPHTHALMIC

## 2013-11-23 MED ORDER — ONDANSETRON HCL 4 MG/2ML IJ SOLN: 4.0000 mg | Freq: Once | INTRAMUSCULAR | Status: DC | PRN

## 2013-11-23 MED ORDER — FENTANYL CITRATE 0.05 MG/ML IJ SOLN
INTRAMUSCULAR | Status: AC
  Filled 2013-11-23: qty 2

## 2013-11-23 MED ORDER — EPINEPHRINE HCL 1 MG/ML IJ SOLN
INTRAMUSCULAR | Status: AC
  Filled 2013-11-23: qty 1

## 2013-11-23 MED ORDER — PHENYLEPHRINE HCL 2.5 % OP SOLN
OPHTHALMIC | Status: AC
  Filled 2013-11-23: qty 15

## 2013-11-23 MED ORDER — PHENYLEPHRINE HCL 2.5 % OP SOLN
1.0000 [drp] | OPHTHALMIC | Status: AC | PRN
  Administered 2013-11-23 (×3): 1 [drp] via OPHTHALMIC

## 2013-11-23 MED ORDER — MIDAZOLAM HCL 2 MG/2ML IJ SOLN
INTRAMUSCULAR | Status: AC
  Filled 2013-11-23: qty 2

## 2013-11-23 SURGICAL SUPPLY — 25 items
CAPSULAR TENSION RING-AMO (OPHTHALMIC RELATED) IMPLANT
CLOTH BEACON ORANGE TIMEOUT ST (SAFETY) ×3 IMPLANT
EYE SHIELD UNIVERSAL CLEAR (GAUZE/BANDAGES/DRESSINGS) ×3 IMPLANT
GLOVE BIO SURGEON STRL SZ 6.5 (GLOVE) IMPLANT
GLOVE BIO SURGEONS STRL SZ 6.5 (GLOVE)
GLOVE ECLIPSE 6.5 STRL STRAW (GLOVE) IMPLANT
GLOVE ECLIPSE 7.0 STRL STRAW (GLOVE) IMPLANT
GLOVE EXAM NITRILE LRG STRL (GLOVE) IMPLANT
GLOVE EXAM NITRILE MD LF STRL (GLOVE) IMPLANT
GLOVE SKINSENSE NS SZ6.5 (GLOVE)
GLOVE SKINSENSE STRL SZ6.5 (GLOVE) IMPLANT
GLOVE SS N UNI LF 7.0 STRL (GLOVE) ×3 IMPLANT
HEALON 5 0.6 ML (INTRAOCULAR LENS) IMPLANT
KIT VITRECTOMY (OPHTHALMIC RELATED) IMPLANT
PAD ARMBOARD 7.5X6 YLW CONV (MISCELLANEOUS) ×3 IMPLANT
PROC W NO LENS (INTRAOCULAR LENS)
PROC W SPEC LENS (INTRAOCULAR LENS)
PROCESS W NO LENS (INTRAOCULAR LENS) IMPLANT
PROCESS W SPEC LENS (INTRAOCULAR LENS) IMPLANT
RING MALYGIN (MISCELLANEOUS) IMPLANT
SIGHTPATH CAT PROC W REG LENS (Ophthalmic Related) ×3 IMPLANT
TAPE SURG TRANSPORE 1 IN (GAUZE/BANDAGES/DRESSINGS) ×1 IMPLANT
TAPE SURGICAL TRANSPORE 1 IN (GAUZE/BANDAGES/DRESSINGS) ×2
VISCOELASTIC ADDITIONAL (OPHTHALMIC RELATED) IMPLANT
WATER STERILE IRR 250ML POUR (IV SOLUTION) ×3 IMPLANT

## 2013-11-23 NOTE — Anesthesia Preprocedure Evaluation (Signed)
Anesthesia Evaluation  Patient identified by MRN, date of birth, ID band Patient awake    Reviewed: Allergy & Precautions, H&P , NPO status , Patient's Chart, lab work & pertinent test results  Airway Mallampati: II  Neck ROM: Full    Dental  (+) Missing, Teeth Intact   Pulmonary neg pulmonary ROS, former smoker,  breath sounds clear to auscultation        Cardiovascular hypertension, Pt. on medications + CAD, + Past MI, + Cardiac Stents and + Peripheral Vascular Disease Rhythm:Regular Rate:Normal     Neuro/Psych PSYCHIATRIC DISORDERS Depression CVA, No Residual Symptoms    GI/Hepatic hiatal hernia,   Endo/Other    Renal/GU      Musculoskeletal   Abdominal   Peds  Hematology   Anesthesia Other Findings   Reproductive/Obstetrics                           Anesthesia Physical Anesthesia Plan  ASA: III  Anesthesia Plan: MAC   Post-op Pain Management:    Induction: Intravenous  Airway Management Planned: Nasal Cannula  Additional Equipment:   Intra-op Plan:   Post-operative Plan:   Informed Consent: I have reviewed the patients History and Physical, chart, labs and discussed the procedure including the risks, benefits and alternatives for the proposed anesthesia with the patient or authorized representative who has indicated his/her understanding and acceptance.     Plan Discussed with:   Anesthesia Plan Comments:         Anesthesia Quick Evaluation  

## 2013-11-23 NOTE — H&P (Signed)
The patient was re examined and there is no change in the patients condition since the original H and P. 

## 2013-11-23 NOTE — Op Note (Signed)
Patient brought to the operating room and prepped and draped in the usual manner.  Lid speculum inserted in right eye.  Stab incision made at the twelve o'clock position.  Provisc instilled in the anterior chamber.   A 2.4 mm. Stab incision was made temporally.  An anterior capsulotomy was done with a bent 25 gauge needle.  The nucleus was hydrodissected.  The Phaco tip was inserted in the anterior chamber and the nucleus was emulsified.  CDE was 5.17.  The cortical material was then removed with the I and A tip.  Posterior capsule was the polished.  The anterior chamber was deepened with Provisc.  A 26.5 Diopter Rayner 570C IOL was then inserted in the capsular bag.  Provisc was then removed with the I and A tip.  The wound was then hydrated.  Patient sent to the Recovery Room in good condition with follow up in my office.  Preoperative Diagnosis:  Nuclear Cataract OD Postoperative Diagnosis:  Same Procedure name: Kelman Phacoemulsification OD with IOL

## 2013-11-23 NOTE — Discharge Instructions (Signed)
Angela Lambert  11/23/2013           [facility] Instructions [name] [address]      1. Avoid closing eyes tightly. One often closes the eye tightly when laughing, talking, sneezing, coughing or if they feel irritated. At these times, you should be careful not to close your eyes tightly.  2. Instill eye drops as instructed. To instill drops in your eye, open it, look up and have someone gently pull the lower lid down and instill a couple of drops inside the lower lid.  3. Do not touch upper lid.  4. Take Advil or Tylenol for pain.  5. You may use either eye for near work, such as reading or sewing and you may watch television.  6. You may have your hair done at the beauty parlor at any time.  7. Wear dark glasses with or without your own glasses if you are in bright light.  8. Call our office at [phone] or [phone] if you have sharp pain in your eye or unusual symptoms.  9. Do not be concerned because vision in the operative eye is not good. It will not be good, no matter how successful the operation, until you get a special lens for it. Your old glasses will not be suited to the new eye that was operated on and you will not be ready for a new lens for about a month.  10. Follow up at the [facility] office.    I have received a copy of the above instructions and will follow them.     Follow up with Dr. Gershon Crane today at his office between 2-3 pm

## 2013-11-23 NOTE — Anesthesia Postprocedure Evaluation (Signed)
  Anesthesia Post-op Note  Patient: Angela Lambert  Procedure(s) Performed: Procedure(s) with comments: CATARACT EXTRACTION PHACO AND INTRAOCULAR LENS PLACEMENT (IOC) (Right) - CDE:  5.17  Patient Location: Short Stay  Anesthesia Type:MAC  Level of Consciousness: awake, alert  and oriented  Airway and Oxygen Therapy: Patient Spontanous Breathing  Post-op Pain: none  Post-op Assessment: Post-op Vital signs reviewed, Patient's Cardiovascular Status Stable, Respiratory Function Stable, Patent Airway and No signs of Nausea or vomiting  Post-op Vital Signs: Reviewed and stable  Last Vitals:  Filed Vitals:   11/23/13 0905  BP: 118/51  Pulse: 61  Temp: 36.4 C  Resp: 18    Complications: No apparent anesthesia complications

## 2013-11-23 NOTE — Transfer of Care (Signed)
Immediate Anesthesia Transfer of Care Note  Patient: Angela Lambert  Procedure(s) Performed: Procedure(s) with comments: CATARACT EXTRACTION PHACO AND INTRAOCULAR LENS PLACEMENT (IOC) (Right) - CDE:  5.17  Patient Location: Short Stay  Anesthesia Type:MAC  Level of Consciousness: awake  Airway & Oxygen Therapy: Patient Spontanous Breathing  Post-op Assessment: Report given to PACU RN  Post vital signs: Reviewed  Complications: No apparent anesthesia complications

## 2013-11-24 ENCOUNTER — Encounter (HOSPITAL_COMMUNITY): Payer: Self-pay | Admitting: Ophthalmology

## 2013-11-30 ENCOUNTER — Encounter (HOSPITAL_COMMUNITY)
Admission: RE | Admit: 2013-11-30 | Discharge: 2013-11-30 | Disposition: A | Discharge status: Home / Self Care | Payer: Medicare HMO | Source: Ambulatory Visit | Attending: Ophthalmology | Admitting: Ophthalmology

## 2013-12-01 ENCOUNTER — Ambulatory Visit: Payer: Medicare HMO | Admitting: Family Medicine

## 2013-12-07 ENCOUNTER — Encounter (HOSPITAL_COMMUNITY): Payer: Self-pay | Admitting: Anesthesiology

## 2013-12-07 ENCOUNTER — Encounter (HOSPITAL_COMMUNITY): Payer: Medicare HMO | Admitting: Anesthesiology

## 2013-12-07 ENCOUNTER — Ambulatory Visit (HOSPITAL_COMMUNITY): Payer: Medicare HMO | Admitting: Anesthesiology

## 2013-12-07 ENCOUNTER — Encounter (HOSPITAL_COMMUNITY)
Admission: RE | Disposition: A | Discharge status: Home / Self Care | Payer: Self-pay | Source: Ambulatory Visit | Attending: Ophthalmology

## 2013-12-07 ENCOUNTER — Ambulatory Visit (HOSPITAL_COMMUNITY)
Admission: RE | Admit: 2013-12-07 | Discharge: 2013-12-07 | Disposition: A | Discharge status: Home / Self Care | Payer: Medicare HMO | Source: Ambulatory Visit | Attending: Ophthalmology | Admitting: Ophthalmology

## 2013-12-07 DIAGNOSIS — H2510 Age-related nuclear cataract, unspecified eye: Secondary | ICD-10-CM | POA: Insufficient documentation

## 2013-12-07 HISTORY — PX: CATARACT EXTRACTION W/PHACO: SHX586

## 2013-12-07 SURGERY — PHACOEMULSIFICATION, CATARACT, WITH IOL INSERTION
Anesthesia: Monitor Anesthesia Care | Site: Eye | Laterality: Left

## 2013-12-07 MED ORDER — KETOROLAC TROMETHAMINE 0.5 % OP SOLN
OPHTHALMIC | Status: AC
  Filled 2013-12-07: qty 5

## 2013-12-07 MED ORDER — FENTANYL CITRATE 0.05 MG/ML IJ SOLN
25.0000 ug | INTRAMUSCULAR | Status: AC
  Administered 2013-12-07: 25 ug via INTRAVENOUS

## 2013-12-07 MED ORDER — EPINEPHRINE HCL 1 MG/ML IJ SOLN
INTRAOCULAR | Status: DC | PRN
  Administered 2013-12-07: 08:00:00

## 2013-12-07 MED ORDER — CYCLOPENTOLATE-PHENYLEPHRINE 0.2-1 % OP SOLN
1.0000 [drp] | OPHTHALMIC | Status: AC
  Administered 2013-12-07 (×3): 1 [drp] via OPHTHALMIC

## 2013-12-07 MED ORDER — PROVISC 10 MG/ML IO SOLN
INTRAOCULAR | Status: DC | PRN
  Administered 2013-12-07: 0.85 mL via INTRAOCULAR

## 2013-12-07 MED ORDER — TETRACAINE 0.5 % OP SOLN OPTIME - NO CHARGE
OPHTHALMIC | Status: DC | PRN
  Administered 2013-12-07: 1 [drp] via OPHTHALMIC

## 2013-12-07 MED ORDER — TETRACAINE HCL 0.5 % OP SOLN
1.0000 [drp] | OPHTHALMIC | Status: AC
  Administered 2013-12-07 (×3): 1 [drp] via OPHTHALMIC

## 2013-12-07 MED ORDER — BSS IO SOLN
INTRAOCULAR | Status: DC | PRN
  Administered 2013-12-07: 15 mL

## 2013-12-07 MED ORDER — FENTANYL CITRATE 0.05 MG/ML IJ SOLN
INTRAMUSCULAR | Status: AC
  Filled 2013-12-07: qty 2

## 2013-12-07 MED ORDER — PHENYLEPHRINE HCL 2.5 % OP SOLN
1.0000 [drp] | OPHTHALMIC | Status: AC
  Administered 2013-12-07 (×3): 1 [drp] via OPHTHALMIC

## 2013-12-07 MED ORDER — MIDAZOLAM HCL 2 MG/2ML IJ SOLN
INTRAMUSCULAR | Status: AC
  Filled 2013-12-07: qty 2

## 2013-12-07 MED ORDER — TETRACAINE HCL 0.5 % OP SOLN
OPHTHALMIC | Status: AC
  Filled 2013-12-07: qty 2

## 2013-12-07 MED ORDER — PHENYLEPHRINE HCL 2.5 % OP SOLN
OPHTHALMIC | Status: AC
Start: 2013-12-07 — End: 2013-12-07
  Filled 2013-12-07: qty 15

## 2013-12-07 MED ORDER — MIDAZOLAM HCL 2 MG/2ML IJ SOLN
1.0000 mg | INTRAMUSCULAR | Status: DC | PRN
  Administered 2013-12-07: 2 mg via INTRAVENOUS

## 2013-12-07 MED ORDER — KETOROLAC TROMETHAMINE 0.5 % OP SOLN
1.0000 [drp] | OPHTHALMIC | Status: AC
  Administered 2013-12-07 (×3): 1 [drp] via OPHTHALMIC

## 2013-12-07 MED ORDER — CYCLOPENTOLATE-PHENYLEPHRINE OP SOLN OPTIME - NO CHARGE
OPHTHALMIC | Status: AC
  Filled 2013-12-07: qty 2

## 2013-12-07 MED ORDER — EPINEPHRINE HCL 1 MG/ML IJ SOLN
INTRAMUSCULAR | Status: AC
  Filled 2013-12-07: qty 1

## 2013-12-07 MED ORDER — LACTATED RINGERS IV SOLN
INTRAVENOUS | Status: DC
  Administered 2013-12-07: 1000 mL via INTRAVENOUS

## 2013-12-07 SURGICAL SUPPLY — 24 items
CAPSULAR TENSION RING-AMO (OPHTHALMIC RELATED) IMPLANT
CLOTH BEACON ORANGE TIMEOUT ST (SAFETY) ×3 IMPLANT
EYE SHIELD UNIVERSAL CLEAR (GAUZE/BANDAGES/DRESSINGS) ×3 IMPLANT
GLOVE BIO SURGEON STRL SZ 6.5 (GLOVE) ×2 IMPLANT
GLOVE BIO SURGEONS STRL SZ 6.5 (GLOVE) ×1
GLOVE ECLIPSE 6.5 STRL STRAW (GLOVE) IMPLANT
GLOVE ECLIPSE 7.0 STRL STRAW (GLOVE) IMPLANT
GLOVE EXAM NITRILE LRG STRL (GLOVE) ×3 IMPLANT
GLOVE EXAM NITRILE MD LF STRL (GLOVE) IMPLANT
GLOVE SKINSENSE NS SZ6.5 (GLOVE)
GLOVE SKINSENSE STRL SZ6.5 (GLOVE) IMPLANT
HEALON 5 0.6 ML (INTRAOCULAR LENS) IMPLANT
KIT VITRECTOMY (OPHTHALMIC RELATED) IMPLANT
PAD ARMBOARD 7.5X6 YLW CONV (MISCELLANEOUS) ×3 IMPLANT
PROC W NO LENS (INTRAOCULAR LENS)
PROC W SPEC LENS (INTRAOCULAR LENS)
PROCESS W NO LENS (INTRAOCULAR LENS) IMPLANT
PROCESS W SPEC LENS (INTRAOCULAR LENS) IMPLANT
RING MALYGIN (MISCELLANEOUS) IMPLANT
SIGHTPATH CAT PROC W REG LENS (Ophthalmic Related) ×3 IMPLANT
TAPE SURG TRANSPORE 1 IN (GAUZE/BANDAGES/DRESSINGS) ×1 IMPLANT
TAPE SURGICAL TRANSPORE 1 IN (GAUZE/BANDAGES/DRESSINGS) ×2
VISCOELASTIC ADDITIONAL (OPHTHALMIC RELATED) IMPLANT
WATER STERILE IRR 250ML POUR (IV SOLUTION) ×3 IMPLANT

## 2013-12-07 NOTE — Discharge Instructions (Signed)
Angela Lambert Angela Lambert  12/07/2013           [facility] Instructions [name] [address]      1. Avoid closing eyes tightly. One often closes the eye tightly when laughing, talking, sneezing, coughing or if they feel irritated. At these times, you should be careful not to close your eyes tightly.  2. Instill eye drops as instructed. To instill drops in your eye, open it, look up and have someone gently pull the lower lid down and instill a couple of drops inside the lower lid.  3. Do not touch upper lid.  4. Take Advil or Tylenol for pain.  5. You may use either eye for near work, such as reading or sewing and you may watch television.  6. You may have your hair done at the beauty parlor at any time.  7. Wear dark glasses with or without your own glasses if you are in bright light.  8. Call our office at [phone] or [phone] if you have sharp pain in your eye or unusual symptoms.  9. Do not be concerned because vision in the operative eye is not good. It will not be good, no matter how successful the operation, until you get a special lens for it. Your old glasses will not be suited to the new eye that was operated on and you will not be ready for a new lens for about a month.  10. Follow up at the [facility] office.   2-3 pm today    I have received a copy of the above instructions and will follow them.

## 2013-12-07 NOTE — H&P (Signed)
The patient was re examined and there is no change in the patients condition since the original H and P. 

## 2013-12-07 NOTE — Anesthesia Preprocedure Evaluation (Signed)
Anesthesia Evaluation  Patient identified by MRN, date of birth, ID band Patient awake    Reviewed: Allergy & Precautions, H&P , NPO status , Patient's Chart, lab work & pertinent test results  Airway Mallampati: II  Neck ROM: Full    Dental  (+) Missing, Teeth Intact   Pulmonary neg pulmonary ROS, former smoker,  breath sounds clear to auscultation        Cardiovascular hypertension, Pt. on medications + CAD, + Past MI, + Cardiac Stents and + Peripheral Vascular Disease Rhythm:Regular Rate:Normal     Neuro/Psych PSYCHIATRIC DISORDERS Depression CVA, No Residual Symptoms    GI/Hepatic hiatal hernia,   Endo/Other    Renal/GU      Musculoskeletal   Abdominal   Peds  Hematology   Anesthesia Other Findings   Reproductive/Obstetrics                           Anesthesia Physical Anesthesia Plan  ASA: III  Anesthesia Plan: MAC   Post-op Pain Management:    Induction: Intravenous  Airway Management Planned: Nasal Cannula  Additional Equipment:   Intra-op Plan:   Post-operative Plan:   Informed Consent: I have reviewed the patients History and Physical, chart, labs and discussed the procedure including the risks, benefits and alternatives for the proposed anesthesia with the patient or authorized representative who has indicated his/her understanding and acceptance.     Plan Discussed with:   Anesthesia Plan Comments:         Anesthesia Quick Evaluation

## 2013-12-07 NOTE — Anesthesia Postprocedure Evaluation (Signed)
  Anesthesia Post-op Note  Patient: Angela Lambert  Procedure(s) Performed: Procedure(s) with comments: CATARACT EXTRACTION PHACO AND INTRAOCULAR LENS PLACEMENT (IOC) (Left) - CDE:2.60  Patient Location: Short Stay  Anesthesia Type:MAC  Level of Consciousness: awake, alert  and oriented  Airway and Oxygen Therapy: Patient Spontanous Breathing  Post-op Pain: none  Post-op Assessment: Post-op Vital signs reviewed  Post-op Vital Signs: Reviewed  Last Vitals:  Filed Vitals:   12/07/13 0715  BP: 85/45  Temp:   Resp: 16    Complications: No apparent anesthesia complications

## 2013-12-07 NOTE — Transfer of Care (Signed)
Immediate Anesthesia Transfer of Care Note  Patient: Angela Lambert  Procedure(s) Performed: Procedure(s) with comments: CATARACT EXTRACTION PHACO AND INTRAOCULAR LENS PLACEMENT (IOC) (Left) - CDE:2.60  Patient Location: Short Stay  Anesthesia Type:MAC  Level of Consciousness: awake  Airway & Oxygen Therapy: Patient Spontanous Breathing  Post-op Assessment: Report given to PACU RN  Post vital signs: Reviewed  Complications: No apparent anesthesia complications

## 2013-12-07 NOTE — Op Note (Signed)
Patient brought to the operating room and prepped and draped in the usual manner.  Lid speculum inserted in left eye.  Stab incision made at the twelve o'clock position.  Provisc instilled in the anterior chamber.   A 2.4 mm. Stab incision was made temporally.  An anterior capsulotomy was done with a bent 25 gauge needle.  The nucleus was hydrodissected.  The Phaco tip was inserted in the anterior chamber and the nucleus was emulsified.  CDE was 2.60.  The cortical material was then removed with the I and A tip.  Posterior capsule was the polished.  The anterior chamber was deepened with Provisc.  A 26.5 Diopter Rayner 570C IOL was then inserted in the capsular bag.  Provisc was then removed with the I and A tip.  The wound was then hydrated.  Patient sent to the Recovery Room in good condition with follow up in my office.  Preoperative Diagnosis:  Nuclear Cataract OS Postoperative Diagnosis:  Same Procedure name: Kelman Phacoemulsification OS with IOL

## 2013-12-08 ENCOUNTER — Encounter (HOSPITAL_COMMUNITY): Payer: Self-pay | Admitting: Ophthalmology

## 2014-03-15 ENCOUNTER — Telehealth: Payer: Self-pay | Admitting: Family Medicine

## 2014-03-15 NOTE — Telephone Encounter (Signed)
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

## 2014-03-15 NOTE — Telephone Encounter (Signed)
Letter composed.  Will contact patient to see where she would like letter sent.

## 2014-05-31 IMAGING — CR DG ANKLE COMPLETE 3+V*R*
3 series · 3 of 3 positions shown · non-contrast
Comparison: None.

CLINICAL DATA: Fall

RIGHT ANKLE - COMPLETE 3+ VIEW

[view not recorded (1 of 3)]
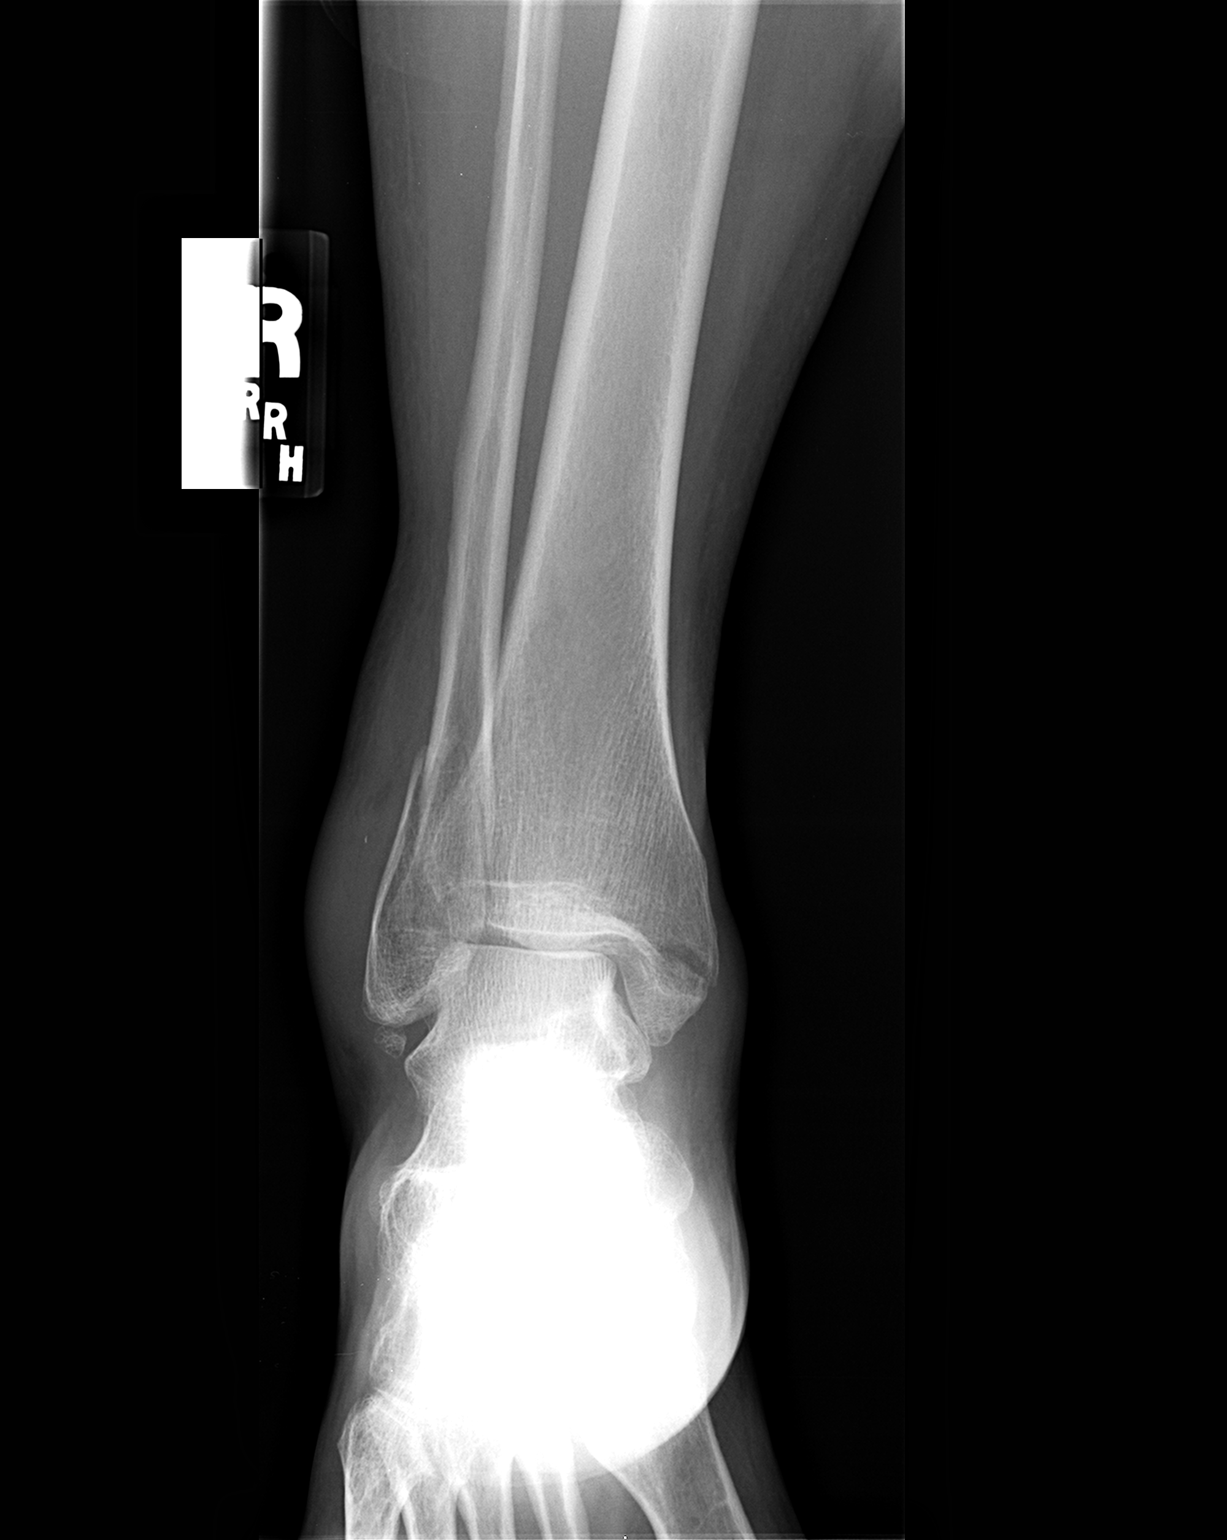

[view not recorded (2 of 3)]
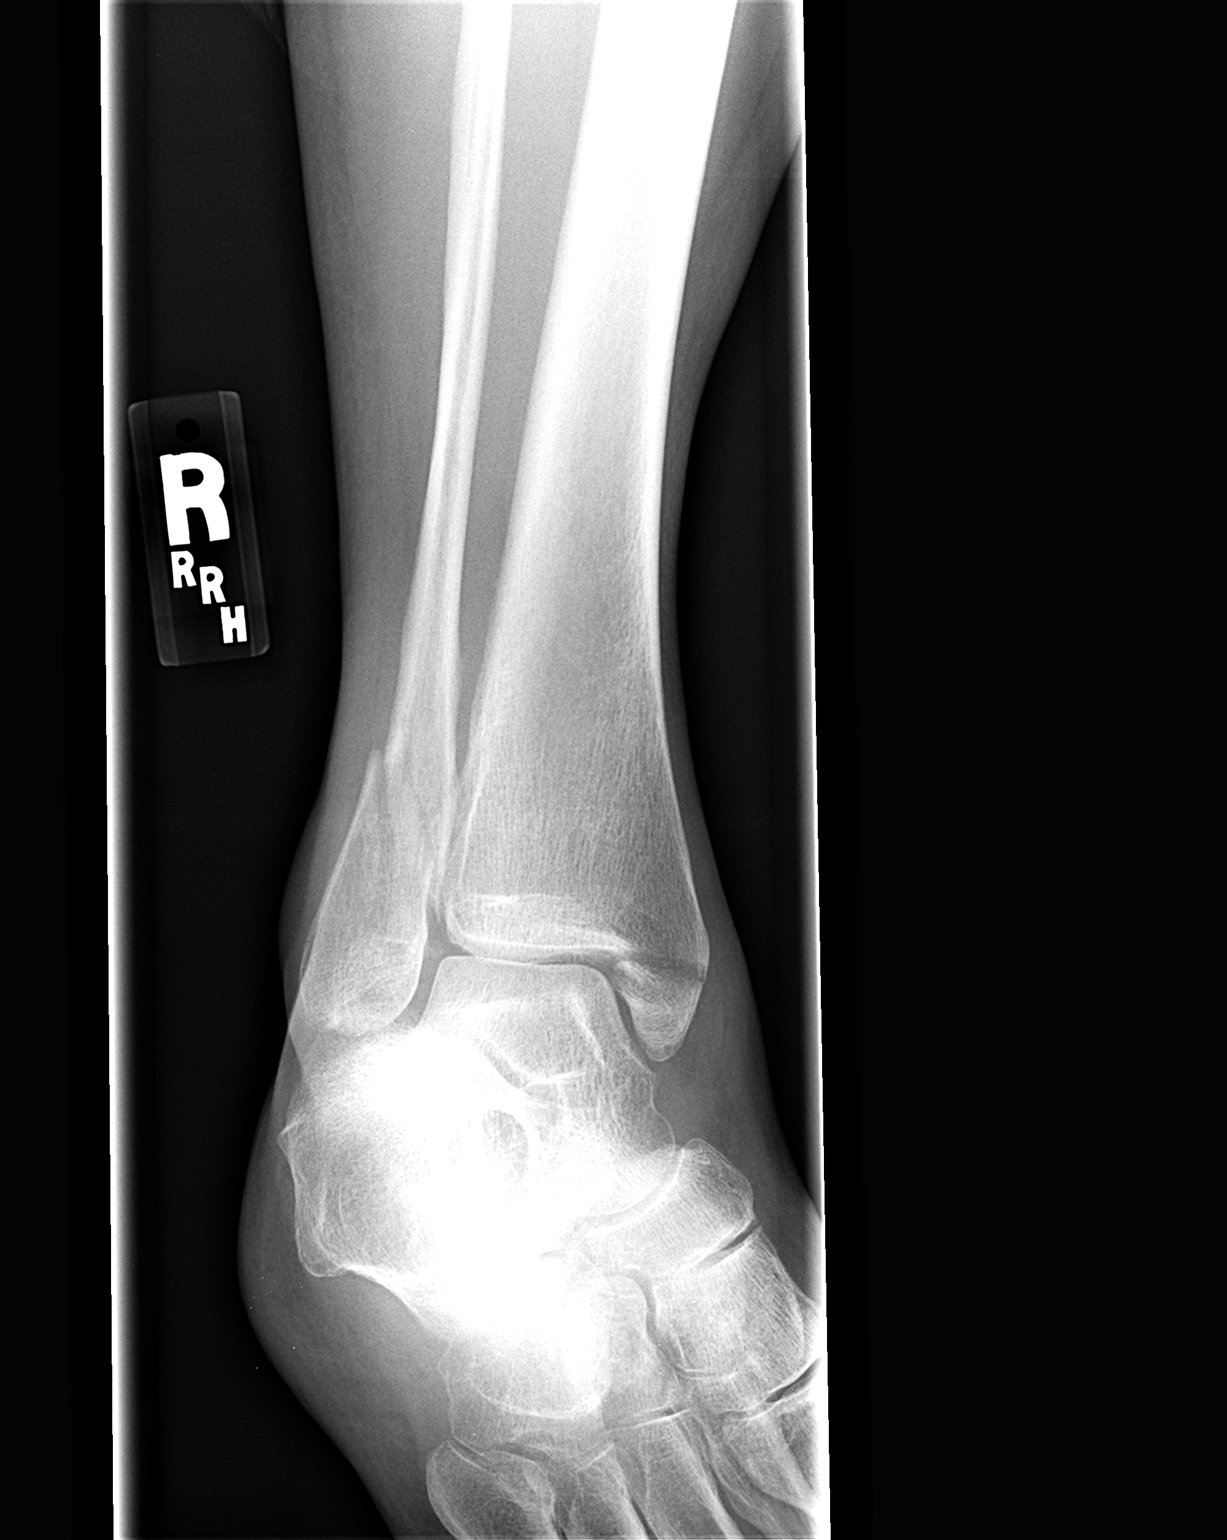

[view not recorded (3 of 3)]
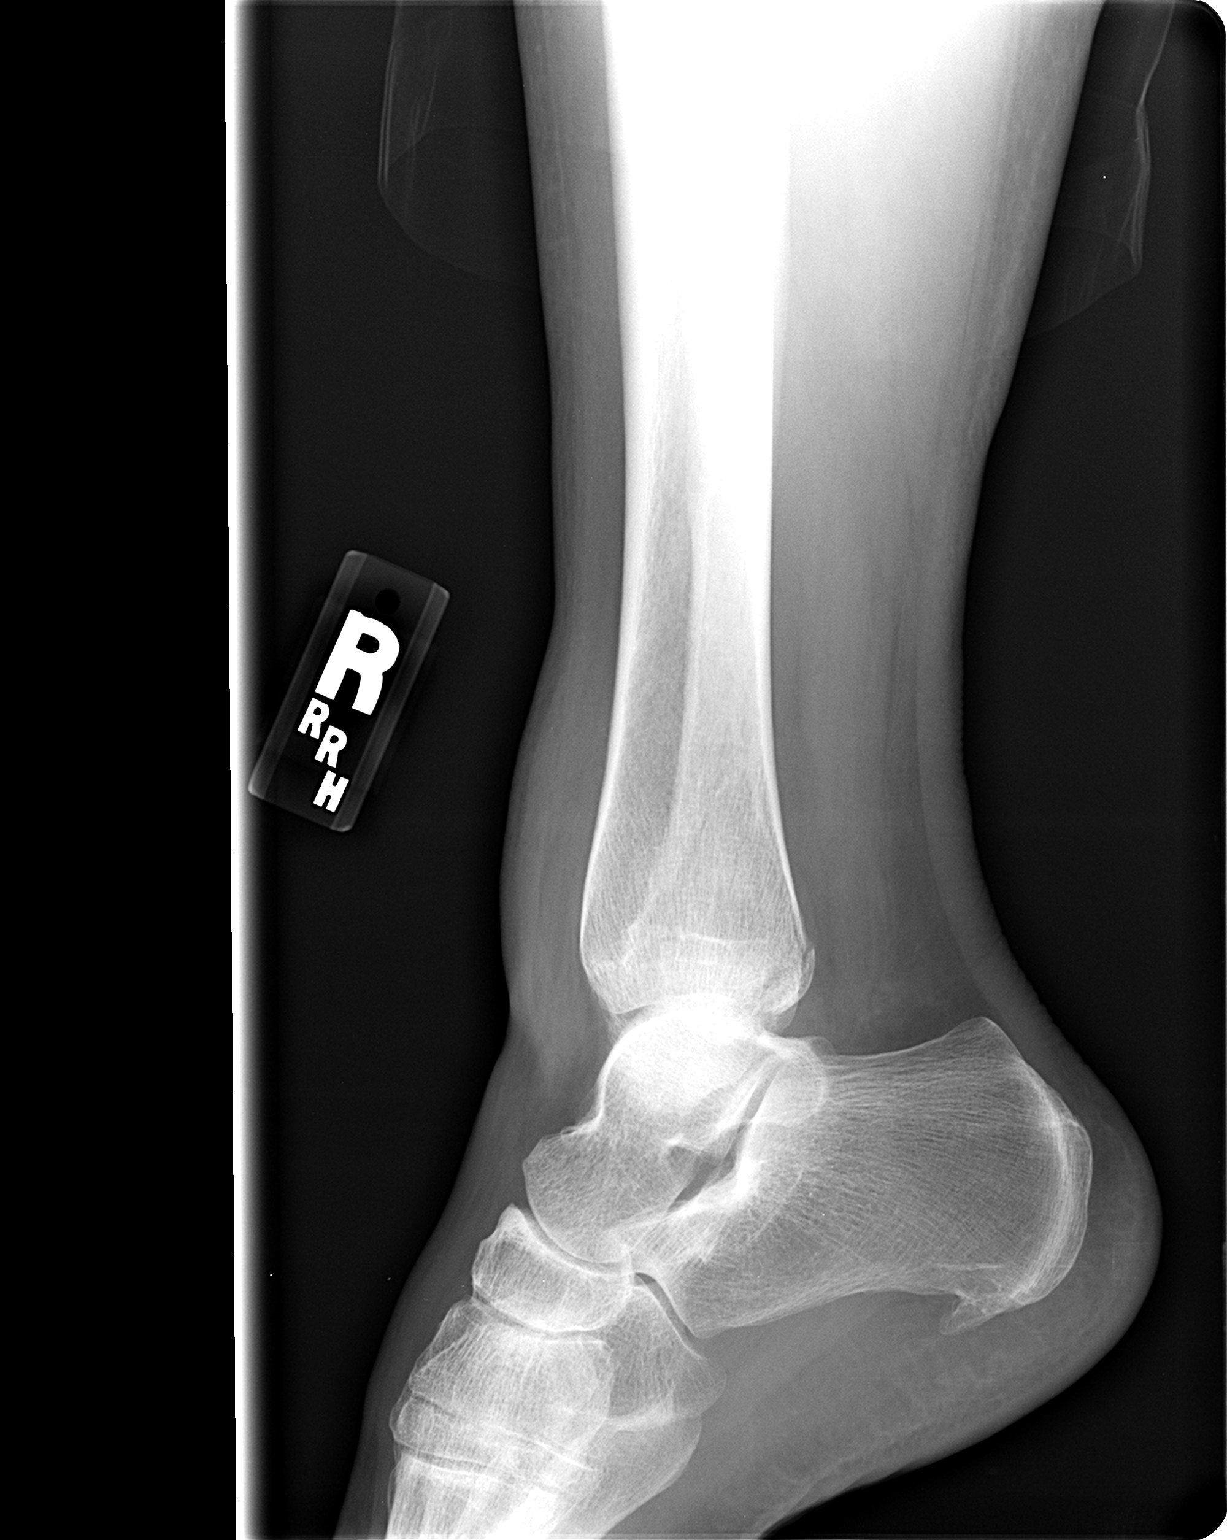

[3 of 3 positions shown; findings below may reference images not displayed]

FINDINGS: Fractures through the medial malleolus is at the level of
the tibial plafond.  The fracture is mildly displaced.  This also
fracture of the distal fibula extending into the ankle joint with
displacement.  The fracture line extends from the tibial plafond
superiorly.  There is associated soft tissue swelling about the
ankle joint.  Minimally displaced posterior malleolar fracture.
Spurring at the inferior calcaneus.
IMPRESSION: Trimalleolar ankle fracture as described.  Weber B.

## 2014-06-02 ENCOUNTER — Encounter (HOSPITAL_COMMUNITY): Payer: Self-pay | Admitting: Ophthalmology

## 2021-08-08 ENCOUNTER — Encounter: Payer: Self-pay | Admitting: *Deleted
